# Patient Record
Sex: Male | Born: 1975 | Race: Black or African American | Hispanic: No | Marital: Single | Smoking: Never smoker
Health system: Southern US, Community
[De-identification: ages and names within clinical notes are randomized; demographics above are authoritative.]

## PROBLEM LIST (undated history)

## (undated) DIAGNOSIS — E119 Type 2 diabetes mellitus without complications: Secondary | ICD-10-CM

## (undated) DIAGNOSIS — I10 Essential (primary) hypertension: Secondary | ICD-10-CM

## (undated) DIAGNOSIS — E669 Obesity, unspecified: Secondary | ICD-10-CM

## (undated) HISTORY — DX: Essential (primary) hypertension: I10

## (undated) HISTORY — PX: NASAL SEPTUM SURGERY: SHX37

## (undated) HISTORY — PX: TONSILLECTOMY: SUR1361

## (undated) HISTORY — DX: Obesity, unspecified: E66.9

---

## 2001-06-14 ENCOUNTER — Emergency Department (HOSPITAL_COMMUNITY): Admission: EM | Admit: 2001-06-14 | Discharge: 2001-06-14 | Payer: Self-pay | Admitting: Emergency Medicine

## 2003-11-08 ENCOUNTER — Emergency Department (HOSPITAL_COMMUNITY): Admission: EM | Admit: 2003-11-08 | Discharge: 2003-11-08 | Payer: Self-pay | Admitting: *Deleted

## 2004-01-02 ENCOUNTER — Emergency Department (HOSPITAL_COMMUNITY): Admission: EM | Admit: 2004-01-02 | Discharge: 2004-01-02 | Payer: Self-pay

## 2006-03-29 ENCOUNTER — Ambulatory Visit (HOSPITAL_BASED_OUTPATIENT_CLINIC_OR_DEPARTMENT_OTHER): Admission: RE | Admit: 2006-03-29 | Discharge: 2006-03-29 | Payer: Self-pay | Admitting: Otolaryngology

## 2006-04-01 ENCOUNTER — Ambulatory Visit: Payer: Self-pay | Admitting: Internal Medicine

## 2007-05-08 ENCOUNTER — Emergency Department (HOSPITAL_COMMUNITY): Admission: EM | Admit: 2007-05-08 | Discharge: 2007-05-08 | Payer: Self-pay | Admitting: Emergency Medicine

## 2008-04-23 ENCOUNTER — Ambulatory Visit (HOSPITAL_BASED_OUTPATIENT_CLINIC_OR_DEPARTMENT_OTHER): Admission: RE | Admit: 2008-04-23 | Discharge: 2008-04-23 | Payer: Self-pay | Admitting: Otolaryngology

## 2008-04-25 ENCOUNTER — Ambulatory Visit: Payer: Self-pay | Admitting: Internal Medicine

## 2008-06-26 ENCOUNTER — Encounter (INDEPENDENT_AMBULATORY_CARE_PROVIDER_SITE_OTHER): Payer: Self-pay | Admitting: Otolaryngology

## 2008-06-26 ENCOUNTER — Inpatient Hospital Stay (HOSPITAL_COMMUNITY): Admission: AD | Admit: 2008-06-26 | Discharge: 2008-06-27 | Payer: Self-pay | Admitting: Otolaryngology

## 2010-08-30 LAB — CBC
HCT: 37.7 % — ABNORMAL LOW (ref 39.0–52.0)
Hemoglobin: 12.7 g/dL — ABNORMAL LOW (ref 13.0–17.0)
MCHC: 33.7 g/dL (ref 30.0–36.0)
MCV: 78.4 fL (ref 78.0–100.0)
Platelets: 252 10*3/uL (ref 150–400)
RBC: 4.8 MIL/uL (ref 4.22–5.81)
RDW: 13.6 % (ref 11.5–15.5)
WBC: 7.3 10*3/uL (ref 4.0–10.5)

## 2010-08-30 LAB — BASIC METABOLIC PANEL
BUN: 10 mg/dL (ref 6–23)
CO2: 26 mEq/L (ref 19–32)
Calcium: 9.1 mg/dL (ref 8.4–10.5)
Chloride: 103 mEq/L (ref 96–112)
Creatinine, Ser: 1.15 mg/dL (ref 0.4–1.5)
GFR calc Af Amer: >60 mL/min (ref >60)
GFR calc non Af Amer: >60 mL/min (ref >60)
Glucose, Bld: 149 mg/dL — ABNORMAL HIGH (ref 70–99)
Potassium: 3.6 mEq/L (ref 3.5–5.1)
Sodium: 139 mEq/L (ref 135–145)

## 2010-08-30 LAB — URINALYSIS, ROUTINE W REFLEX MICROSCOPIC
Bilirubin Urine: NEGATIVE
Glucose, UA: NEGATIVE mg/dL
Hgb urine dipstick: NEGATIVE
Ketones, ur: NEGATIVE mg/dL
Nitrite: NEGATIVE
Protein, ur: NEGATIVE mg/dL
Specific Gravity, Urine: 1.021 (ref 1.005–1.030)
Urobilinogen, UA: 0.2 mg/dL (ref 0.0–1.0)
pH: 6.5 (ref 5.0–8.0)

## 2010-09-27 NOTE — Consult Note (Signed)
NAME:  Jeffery Bailey, Jeffery Bailey NO.:  [id number]   MEDICAL RECORD NO.:  [id number]          PATIENT TYPE:  INP   LOCATION:  2603                         FACILITY:  [facility]   PHYSICIAN:  Cassell Clement, M.D. DATE OF BIRTH:  [birth year]   DATE OF CONSULTATION:  06/26/2008  DATE OF DISCHARGE:                                 CONSULTATION   I was asked to see this pleasant 35 year old gentleman by Dr. Haroldine Laws  for evaluation and management of postoperative hypertension.   This is a 35 year old married African American gentleman, who underwent  nasal septoplasty and tonsillectomy today by Dr. Haroldine Laws.  Preoperatively, his blood pressure was elevated slightly at 160/99 and  postoperatively his systolics have been running above 210.  He has  received Apresoline IV once and labetalol intravenously twice in the  recovery room.  The patient does not have any prior history of known  hypertension.  He has no prior cardiac history of any kind.  He is on no  medications at home whatsoever.  He denies chest pain, palpitations, or  dyspnea.   SOCIAL HISTORY:  He works for [employer].  He runs a machine.  He does  not use tobacco, but does drink some alcohol.  The patient is married  and has one child.   FAMILY HISTORY:  Positive for hypertension in his mother, who is living.  His father also is living, but does not have high blood pressure.   PAST MEDICAL HISTORY:  No prior hospitalizations or surgery.   REVIEW OF SYSTEMS:  GASTROINTESTINAL:  No history of peptic ulcer or  dyspepsia.  GENITOURINARY:  Positive for nocturia.  RESPIRATORY:  No  cough or sputum production.  CARDIOVASCULAR:  Negative in detail.   All other systems are negative.   ALLERGIES:  The patient has no known drug allergies.   PHYSICAL EXAMINATION:  VITAL SIGNS:  Blood pressure is presently 191/88,  pulse of 82 and regular, O2 sat with an O2 mask is averaging 99-100%.  GENERAL APPEARANCE:  A large gentleman.   He weighs 310 pounds.  He is in  no acute distress.  SKIN:  Warm and dry.  HEAD/NECK:  The patient has a dressing over his nose area.  Pupils equal  and reactive.  Jugular venous pressure normal.  Carotids normal.  Thyroid normal.  CHEST:  Clear.  HEART:  No murmur, gallop, rub, or click.  ABDOMEN:  Soft and nontender without hepatosplenomegaly or masses.  EXTREMITIES:  Good pedal pulses.  No edema.  No phlebitis.  NEUROLOGIC:  He is alert and oriented.   His electrocardiogram shows normal sinus rhythm and no acute changes.  His preoperative CBC was normal.   IMPRESSION:  Postoperative hypertension with question of preoperative  underlying untreated essential hypertension.   DISPOSITION:  We will plan to get a baseline chest x-ray and a baseline  CMET today.  We will begin him on lisinopril HCT 10/25 one daily, and we  will continue his IV labetalol 20 mg every 4 hours p.r.n. for systolic  pressures greater than 170.  We will anticipate that he will be  observed  overnight on 3300 and then should be able to go home in the morning.  We  expect that his blood pressure would come down overnight.  Dr. Peter  [place] will see the patient tomorrow morning.  We will plan at this  point to send him home on combination of ACE and hydrochlorothiazide and  depending on his response he may also require low-dose beta-blocker.  I  will plan to see him back in the office for blood pressure followup in  about 7-10 days and get a followup BMET at that time as well.           ______________________________  Cassell Clement, M.D.     TB/MEDQ  D:  06/26/2008  T:  06/27/2008  Job:  [id number]   cc:   Hermelinda Medicus, M.D.

## 2010-09-27 NOTE — H&P (Signed)
NAME:  Jeffery Bailey, Jeffery Bailey NO.:  [id number]   MEDICAL RECORD NO.:  [id number]          PATIENT TYPE:  INP   LOCATION:                               FACILITY:  [facility]   PHYSICIAN:  Hermelinda Medicus, M.D.   DATE OF BIRTH:  03/10/1976   DATE OF ADMISSION:  06/26/2008  DATE OF DISCHARGE:  06/27/2008                              HISTORY & PHYSICAL   This patient is a 35 year old male who weighs 315 pounds.  He had a  sleep study which showed BMI of 41, AHI of 9.4, lowest O2 was 86%, RDI  of 9.5.  He spent 25% of his time in REM and deep and stage III sleep,  but has considerable sleep issues with snoring though he does not have  full apnea issue.  He does have a severe septal deviation.  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  He also has a large  tonsils where he has had considerable tonsillitis issues as well.  He  now enters for these 2 diagnoses, one was snoring with septal deviation,  the second is tonsillitis with hypertrophied tonsils.   He takes no medications; however, his past history is one of blood  pressure elevation and his family and he himself coming has a blood  pressure finding of 178/106, 163/102, and during surgery he will be  observed very carefully for this.   PHYSICAL EXAMINATION:  HEENT:  His ears are clear.  Tympanic membranes  are clear.  Oral cavity is completely clear of any ulceration, mass, or  lesion; however, he does have large tonsils which do show some exophytic  nature.  His larynx is clear.  True cords, false cords, epiglottis, base  of tongue are clear.  True cord mobility, gag reflex, tongue mobility,  EOMs, and facial nerve are all symmetrical.  His nose shows quite a  severe septal deviation.  In fact, he appears to have had a septal  hematoma where he has widening of his septum in the ethmoid region with  deviation to both sides causing blockage.  He also has turbinate  hypertrophy and a fairly small nose with  just poor nasal airway.  His  neck is full.  His tongue rides high in his mouth though there is no  evidence of any swelling or carotid or submaxillary gland swelling.  CHEST:  Clear rales, rhonchi or wheezes.  CARDIOVASCULAR:  Normal S1 and S2.  No murmurs, rubs, or gallops.  EXTREMITIES:  Unremarkable.  ABDOMEN:  Unremarkable except he is overweight at 315.   Initial diagnosis is septal deviation, turbinate hypertrophy with nasal  obstruction with severe snoring with tonsillitis, with tonsillar  hypertrophy.   Plan is to do a septal reconstruction, turbinate reduction, and a  tonsillectomy.            ______________________________  Hermelinda Medicus, M.D.     JC/MEDQ  D:  06/26/2008  T:  06/26/2008  Job:  [id number]   cc:   Cassell Clement, M.D.

## 2010-09-27 NOTE — Procedures (Signed)
NAME:  Jeffery Bailey, WEATHERALL NO.:  [id number]   MEDICAL RECORD NO.:  [id number]          PATIENT TYPE:  OUT   LOCATION:  SLEEP CENTER                 FACILITY[facility]  [facility]   PHYSICIAN:  Clinton D. Maple Hudson, MD, FCCP, FACPDATE OF BIRTH:  1974[birth year]03-19   DATE OF STUDY:  04/23/2008                            NOCTURNAL POLYSOMNOGRAM   REFERRING PHYSICIAN:  Hermelinda Medicus, M.D.   REFERRING PHYSICIAN:  Hermelinda Medicus, MD   INDICATION FOR STUDY:  Hypersomnia with sleep apnea.   EPWORTH SLEEPINESS SCORE:  7/24.  BMI 41.1.  Weight 320 pounds.  Height  74 inches.  Neck 19 inches.   HOME MEDICATIONS:  None reported.  A diagnostic NPSG on March 29, 2006, had recorded an AHI of 9.4 per hour.  Diagnostic protocol was  requested for the current study.   SLEEP ARCHITECTURE:  Total sleep time 277 minutes with sleep efficiency  71%.  Stage I was 6.1%.  Stage II 68.1%.  Stage III absent.  REM 25.8%  of total sleep time.  Sleep latency is 16 minutes.  REM latency 60  minutes.  Awake after sleep onset 97 minutes.  Arousal index 13.6.  No  bedtime medication was taken.   RESPIRATORY DATA:  Apnea-hypopnea index (AHI) 4.1 per hour.  Respiratory  disturbance index (RDI) 9.5 per hour with a total of 25 respiratory  events related to arousal for an index of 5.4 per hour.  A total of 19  events were scored including 2 obstructive apneas, 1 central apnea, and  16 hypopneas.  Events were more common while supine.  REM AHI 10.9 per  hour.   OXYGEN DATA:  Moderate to very loud snoring with oxygen desaturation to  a nadir of 86%.  Mean oxygen saturation through the study was 97.5% on  room air.   CARDIAC DATA:  Normal sinus rhythm.   MOVEMENT/PARASOMNIA:  No significant movement disturbance.  Bathroom x1.   IMPRESSION/RECOMMENDATION:  1. Occasional respiratory events with sleep disturbance, apnea-      hypopnea index 4.1 per hour (normal range 0-5 per hour).      Additional events were recorded,  which did not meet duration      requirements to be scored as apneas and hypopneas, giving a      respiratory disturbance index (RDI) of 9.5 per hour, which would      qualify for a diagnosis of mild obstructive sleep apnea/hypopnea      syndrome.  2. Conservative measures including weight loss might be sufficient.      Consider efforts to improve the upper nasal airway.  CPAP would not      usefully be used for scores in this range, consider alternative      methods first.      Clinton D. Maple Hudson, MD, [facility], FACP  Diplomate, Biomedical engineer of Sleep Medicine  Electronically Signed     CDY/MEDQ  D:  04/25/2008 14:06:51  T:  04/25/2008 20:46:52  Job:  [id number]

## 2010-09-27 NOTE — Op Note (Signed)
NAME:  JAD, JOHANSSON NO.:  [id number]   MEDICAL RECORD NO.:  [id number]           PATIENT TYPE:   LOCATION:                                 FACILITY:   PHYSICIAN:  Hermelinda Medicus, M.D.   DATE OF BIRTH:  Oct 05, 1975   DATE OF PROCEDURE:  DATE OF DISCHARGE:                               OPERATIVE REPORT   This patient is a 35 year old male who has severe snoring and a septal  deviation and tonsillitis.  He is aware of the risks and gains of his  surgery, the fact that he could have tonsillar bleeding.  He is going to  be on a special diet for approximately 10 days and he cannot travel for  10 days.  He also will have packing in his nose and is aware that will  limit his activities for at least 2 weeks in terms of physical  activities and he will be out of work for approximately the better part  of a week.   PREOPERATIVE DIAGNOSES:  1. Septal deviation with history of nasal trauma with turbinate      hypertrophy with poor nasal airway.  2. Tonsillitis with tonsillar hypertrophy.   POSTOPERATIVE DIAGNOSES:  1. Septal deviation with history of nasal trauma with turbinate      hypertrophy with poor nasal airway.  2. Tonsillitis with tonsillar hypertrophy.   OPERATION:  Septal reconstruction, turbinate reduction, and  tonsillectomy.   OPERATOR:  Hermelinda Medicus, MD.   ANESTHESIA:  General endotracheal with Dr. Michelle Piper.   PROCEDURE:  The patient was placed in supine position and under general  endotracheal anesthesia the septum was approached where topical cocaine  200 mg was used and 1% Xylocaine with epinephrine.  The turbinates were  outfractured especially the inferior turbinates which were really quite  large and we gained considerable space there.  Middle turbinates were of  reasonable size.  The septum was widened as of it had a hematoma on the  ethmoid vomerine portion of the septum that was off the premaxillary  crest as well and deviated to the right  primarily.  A hemitransfixion  incision was made on his left side and carried back along the  quadrilateral cartilage and a strip of cartilage was taken from the  floor of the nose as well as the posterior from the quadrilateral  cartilage.  The ethmoid septum was widened and deviated and telescoped,  and this was taken down using the open and close Oolitic.  The  vomerine septum was also taken down and then the 4-mm chisel was used to  take down the premaxillary crest deviation which was off the  premaxillary crest with this.  Using the open and close Laren Boom, we were able to trim the vomerine septum was well.  Once we  did this and got that thickened, widened area taken out, then the  remainder of the septum was in quite decent condition.  At that point,  we then closed the incision with 5-0 plain catgut in a through-and-  through septal sutures of blanket stitch using 4-0 plain.  The  turbinates were again outfractured aggressively and then an anesthesia  trumpet was placed on his right side and a Merocel pack was placed on  the left.  Attention was then carried to the oral cavity where the Vernelle Emerald was placed and a very large exudative type tonsils containing  purulent material were removed after the Vernelle Emerald was placed.  The  tonsils were removed using blunt and then Bovie electrocoagulation.  We  removed the tonsils using Bovie coagulation and blunt dissection and  gained considerable space within his throat, but we will resolve this  tonsillitis issue.  Oral cavity was suctioned.  The nasopharynx was  suctioned.  The nasal area was completely dry as were the tonsillar beds  as we slowly removed the [employer].  The patient was taken to  recovery room in good condition doing very well.  The airway was  excellent; however, his blood pressure was still elevated and we are  going to have a cardiologist seen him and will be treating him in the   recovery room under anesthesia care.  He will be kept overnight on 3300  observation.  Further follow up will be at the end of 5 days, 10 days, 3  weeks, 6 weeks, 3 months, and 6 months.           ______________________________  Hermelinda Medicus, M.D.     JC/MEDQ  D:  06/26/2008  T:  06/26/2008  Job:  [id number]   cc:   Cassell Clement, M.D.

## 2010-09-30 NOTE — Procedures (Signed)
NAME:  Jeffery Bailey, Jeffery Bailey NO.:  [id number]   MEDICAL RECORD NO.:  [id number]          PATIENT TYPE:  OUT   LOCATION:  SLEEP CENTER                 FACILITY:  [facility]   PHYSICIAN:  Clinton D. Maple Hudson, MD, FCCP, FACPDATE OF BIRTH:  23[birth year]1974   DATE OF STUDY:  03/29/2006                              NOCTURNAL POLYSOMNOGRAM   INDICATIONS FOR STUDY:  Hypersomnia with sleep apnea. Epworth sleepiness  score 9/24, BMI 38, weight 300 pounds. No medications were listed. A  diagnostic NP SG protocol was requested.   SLEEP ARCHITECTURE:  Total sleep time 368 minutes with sleep efficiency 80%.  Stage 1 was 5%, stage 2 was 62%, stages 3 and 4 18%. REM 15% of total sleep  time. Sleep latency 18 minutes, REM latency 65 minutes, awake after sleep  onset 75 minutes. Arousable index 10.6. No bedtime medications were taken.   RESPIRATORY DATA:  Apnea hypopnea index (AHI, RDI) 9.4 obstructive events  per hour, indicating mild obstructive sleep apnea/hypopnea syndrome. There  were 1 central apnea, 24 obstructive apnea's, and 33 hypopnea's. Events were  not positional. REM AHI 42 per hour. NP SG protocol was followed.   OXYGEN DATA:  Moderate to severe snoring with oxygen desaturation to a nadir  of 78%. Mean oxygen saturation through the study was 97% on room air.   CARDIAC DATA:  Normal sinus rhythm.   MOVEMENT/PARASOMNIA:  A total of 24 limb jerks were reported, which 20 were  associated with arousable or awakening from periodic limb with arousable  index of 3.3 per hour, which is mildly increased.   IMPRESSION/RECOMMENDATIONS:  1. Mild obstructive sleep apnea/hypopnea syndrome, AHI 9.4 per hour. Non-      positional events with moderate to loud snoring and oxygen desaturation      to 78%.  2. CPAP therapy would be considered as a secondary approach with scores in      this range but he can return to CPAP titration if appropriate.  3. Periodic limb movement with arousal, 3.3 per  hour.      Clinton D. Maple Hudson, MD, [facility], FACP  Diplomate, Biomedical engineer of Sleep Medicine  Electronically Signed     CDY/MEDQ  D:  04/01/2006 12:39:35  T:  04/01/2006 21:51:40  Job:  [id number]

## 2010-12-13 ENCOUNTER — Emergency Department (HOSPITAL_COMMUNITY)
Admission: EM | Admit: 2010-12-13 | Discharge: 2010-12-13 | Disposition: A | Discharge status: Home / Self Care | Payer: 59 | Attending: Emergency Medicine | Admitting: Emergency Medicine

## 2010-12-13 DIAGNOSIS — E119 Type 2 diabetes mellitus without complications: Secondary | ICD-10-CM | POA: Insufficient documentation

## 2010-12-13 DIAGNOSIS — I10 Essential (primary) hypertension: Secondary | ICD-10-CM | POA: Insufficient documentation

## 2010-12-13 LAB — BASIC METABOLIC PANEL
BUN: 29 mg/dL — ABNORMAL HIGH (ref 6–23)
CO2: 25 mEq/L (ref 19–32)
Calcium: 10.1 mg/dL (ref 8.4–10.5)
Chloride: 89 mEq/L — ABNORMAL LOW (ref 96–112)
Creatinine, Ser: 1.96 mg/dL — ABNORMAL HIGH (ref 0.50–1.35)
GFR calc Af Amer: 47 mL/min — ABNORMAL LOW (ref >60)
GFR calc non Af Amer: 39 mL/min — ABNORMAL LOW (ref >60)
Glucose, Bld: 645 mg/dL (ref 70–99)
Potassium: 4 mEq/L (ref 3.5–5.1)
Sodium: 125 mEq/L — ABNORMAL LOW (ref 135–145)

## 2010-12-13 LAB — GLUCOSE, CAPILLARY
Glucose-Capillary: >600 mg/dL (ref 70–99)
Glucose-Capillary: 372 mg/dL — ABNORMAL HIGH (ref 70–99)
Glucose-Capillary: 327 mg/dL — ABNORMAL HIGH (ref 70–99)

## 2010-12-13 LAB — DIFFERENTIAL
Basophils Absolute: 0 10*3/uL (ref 0.0–0.1)
Basophils Relative: 0 % (ref 0–1)
Eosinophils Absolute: 0.1 10*3/uL (ref 0.0–0.7)
Eosinophils Relative: 1 % (ref 0–5)
Lymphocytes Relative: 31 % (ref 12–46)
Lymphs Abs: 2.2 10*3/uL (ref 0.7–4.0)
Monocytes Absolute: 0.5 10*3/uL (ref 0.1–1.0)
Monocytes Relative: 7 % (ref 3–12)
Neutro Abs: 4.2 10*3/uL (ref 1.7–7.7)
Neutrophils Relative %: 61 % (ref 43–77)

## 2010-12-13 LAB — URINALYSIS, ROUTINE W REFLEX MICROSCOPIC
Bilirubin Urine: NEGATIVE
Glucose, UA: >1000 mg/dL — AB
Hgb urine dipstick: NEGATIVE
Ketones, ur: NEGATIVE mg/dL
Leukocytes, UA: NEGATIVE
Nitrite: NEGATIVE
Protein, ur: NEGATIVE mg/dL
Specific Gravity, Urine: 1.034 — ABNORMAL HIGH (ref 1.005–1.030)
Urobilinogen, UA: 0.2 mg/dL (ref 0.0–1.0)
pH: 6 (ref 5.0–8.0)

## 2010-12-13 LAB — CBC
HCT: 35.7 % — ABNORMAL LOW (ref 39.0–52.0)
Hemoglobin: 11.7 g/dL — ABNORMAL LOW (ref 13.0–17.0)
MCH: 24.2 pg — ABNORMAL LOW (ref 26.0–34.0)
MCHC: 32.8 g/dL (ref 30.0–36.0)
MCV: 73.8 fL — ABNORMAL LOW (ref 78.0–100.0)
Platelets: 228 10*3/uL (ref 150–400)
RBC: 4.84 MIL/uL (ref 4.22–5.81)
RDW: 12.8 % (ref 11.5–15.5)
WBC: 7 10*3/uL (ref 4.0–10.5)

## 2010-12-13 LAB — URINE MICROSCOPIC-ADD ON: Urine-Other: NONE SEEN

## 2011-02-17 LAB — DIFFERENTIAL
Basophils Absolute: 0
Basophils Relative: 1
Eosinophils Absolute: 0.1
Eosinophils Relative: 1
Lymphocytes Relative: 33
Lymphs Abs: 2.2
Monocytes Absolute: 0.6
Monocytes Relative: 9
Neutro Abs: 3.8
Neutrophils Relative %: 57

## 2011-02-17 LAB — CBC
HCT: 39.1
Hemoglobin: 13.1
MCHC: 33.5
MCV: 77.1 — ABNORMAL LOW
Platelets: 293
RBC: 5.07
RDW: 13.9
WBC: 6.8

## 2011-02-17 LAB — URINALYSIS, ROUTINE W REFLEX MICROSCOPIC
Bilirubin Urine: NEGATIVE
Glucose, UA: NEGATIVE
Ketones, ur: NEGATIVE
Leukocytes, UA: NEGATIVE
Nitrite: NEGATIVE
Protein, ur: 30 — AB
Specific Gravity, Urine: 1.029
Urobilinogen, UA: 0.2
pH: 7.5

## 2011-02-17 LAB — COMPREHENSIVE METABOLIC PANEL
ALT: 20
AST: 21
Albumin: 3.9
Alkaline Phosphatase: 40
BUN: 8
CO2: 26
Calcium: 9.6
Chloride: 108
Creatinine, Ser: 1.33
GFR calc Af Amer: >60
GFR calc non Af Amer: >60
Glucose, Bld: 117 — ABNORMAL HIGH
Potassium: 3.5
Sodium: 142
Total Bilirubin: 0.8
Total Protein: 7.1

## 2011-02-17 LAB — LIPASE, BLOOD: Lipase: 19

## 2011-02-17 LAB — URINE MICROSCOPIC-ADD ON

## 2013-03-26 ENCOUNTER — Emergency Department (HOSPITAL_BASED_OUTPATIENT_CLINIC_OR_DEPARTMENT_OTHER): Payer: No Typology Code available for payment source

## 2013-03-26 ENCOUNTER — Emergency Department (HOSPITAL_BASED_OUTPATIENT_CLINIC_OR_DEPARTMENT_OTHER)
Admission: EM | Admit: 2013-03-26 | Discharge: 2013-03-26 | Disposition: A | Discharge status: Home / Self Care | Payer: No Typology Code available for payment source | Attending: Emergency Medicine | Admitting: Emergency Medicine

## 2013-03-26 ENCOUNTER — Encounter (HOSPITAL_BASED_OUTPATIENT_CLINIC_OR_DEPARTMENT_OTHER): Payer: Self-pay | Admitting: Emergency Medicine

## 2013-03-26 DIAGNOSIS — E119 Type 2 diabetes mellitus without complications: Secondary | ICD-10-CM | POA: Insufficient documentation

## 2013-03-26 DIAGNOSIS — S0990XA Unspecified injury of head, initial encounter: Secondary | ICD-10-CM | POA: Insufficient documentation

## 2013-03-26 DIAGNOSIS — Z79899 Other long term (current) drug therapy: Secondary | ICD-10-CM | POA: Insufficient documentation

## 2013-03-26 DIAGNOSIS — S4980XA Other specified injuries of shoulder and upper arm, unspecified arm, initial encounter: Secondary | ICD-10-CM | POA: Insufficient documentation

## 2013-03-26 DIAGNOSIS — E669 Obesity, unspecified: Secondary | ICD-10-CM | POA: Insufficient documentation

## 2013-03-26 DIAGNOSIS — Y92410 Unspecified street and highway as the place of occurrence of the external cause: Secondary | ICD-10-CM | POA: Insufficient documentation

## 2013-03-26 DIAGNOSIS — I10 Essential (primary) hypertension: Secondary | ICD-10-CM | POA: Insufficient documentation

## 2013-03-26 DIAGNOSIS — S46909A Unspecified injury of unspecified muscle, fascia and tendon at shoulder and upper arm level, unspecified arm, initial encounter: Secondary | ICD-10-CM | POA: Insufficient documentation

## 2013-03-26 DIAGNOSIS — Y9389 Activity, other specified: Secondary | ICD-10-CM | POA: Insufficient documentation

## 2013-03-26 HISTORY — DX: Type 2 diabetes mellitus without complications: E11.9

## 2013-03-26 MED ORDER — ACETAMINOPHEN 500 MG PO TABS
1000.0000 mg | ORAL_TABLET | Freq: Once | ORAL | Status: AC
  Administered 2013-03-26: 1000 mg via ORAL
  Filled 2013-03-26: qty 2
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
## 2014-09-27 ENCOUNTER — Encounter (HOSPITAL_COMMUNITY): Payer: Self-pay | Admitting: *Deleted

## 2014-09-27 ENCOUNTER — Emergency Department (INDEPENDENT_AMBULATORY_CARE_PROVIDER_SITE_OTHER): Payer: 59

## 2014-09-27 ENCOUNTER — Emergency Department (INDEPENDENT_AMBULATORY_CARE_PROVIDER_SITE_OTHER)
Admission: EM | Admit: 2014-09-27 | Discharge: 2014-09-27 | Disposition: A | Discharge status: Home / Self Care | Payer: 59 | Source: Home / Self Care | Attending: Family Medicine | Admitting: Family Medicine

## 2014-09-27 DIAGNOSIS — J069 Acute upper respiratory infection, unspecified: Secondary | ICD-10-CM

## 2014-09-27 MED ORDER — HYDROCOD POLST-CPM POLST ER 10-8 MG/5ML PO SUER: 5.0000 mL | Freq: Two times a day (BID) | ORAL | Status: DC | PRN

## 2014-09-27 MED ORDER — AZITHROMYCIN 250 MG PO TABS: ORAL_TABLET | ORAL | Status: DC

## 2014-09-27 NOTE — ED Provider Notes (Signed)
CSN: [id number]     Arrival date & time 09/27/14  1640 History   First MD Initiated Contact with Patient 09/27/14 1654     Chief Complaint  Patient presents with  . Cough   (Consider location/radiation/quality/duration/timing/severity/associated sxs/prior Treatment) Patient is a 39 y.o. male presenting with cough. The history is provided by the patient and the spouse.  Cough Cough characteristics:  Non-productive and dry Severity:  Moderate Duration:  1 week Progression:  Unchanged Chronicity:  New Context: upper respiratory infection   Relieved by:  None tried Worsened by:  Nothing tried Ineffective treatments:  None tried Associated symptoms: fever and rhinorrhea   Associated symptoms: no chills, no shortness of breath, no sore throat and no wheezing     Past Medical History  Diagnosis Date  . Hypertension   . Obesity   . Diabetes mellitus without complication    Past Surgical History  Procedure Laterality Date  . Nasal septum surgery    . Tonsillectomy     Family History  Problem Relation Age of Onset  . Hyperlipidemia Mother    History  Substance Use Topics  . Smoking status: Never Smoker   . Smokeless tobacco: Not on file  . Alcohol Use: Yes    Review of Systems  Constitutional: Positive for fever. Negative for chills.  HENT: Positive for congestion, postnasal drip and rhinorrhea. Negative for sore throat.   Respiratory: Positive for cough. Negative for shortness of breath and wheezing.   Gastrointestinal: Negative.   Musculoskeletal: Negative.   Skin: Negative.     Allergies  Review of patient's allergies indicates no known allergies.  Home Medications   Prior to Admission medications   Medication Sig Start Date End Date Taking? Authorizing Provider  azithromycin (ZITHROMAX Z-PAK) 250 MG tablet Take as directed on pack 09/27/14   Billy Fischer, MD  chlorpheniramine-HYDROcodone [facility]) 10-8 MG/5ML SUER Take 5 mLs by mouth every 12  (twelve) hours as needed for cough. 09/27/14   Billy Fischer, MD  lisinopril-hydrochlorothiazide (PRINZIDE,ZESTORETIC) 10-12.5 MG per tablet Take 1 tablet by mouth daily.    Historical Provider, MD  Nebivolol HCl (BYSTOLIC) 20 MG TABS Take 20 mg by mouth daily.    Historical Provider, MD   BP 159/90 mmHg  Pulse 66  Temp(Src) 98.7 F (37.1 C) (Oral)  Resp 18  SpO2 96% Physical Exam  Constitutional: He is oriented to person, place, and time. He appears well-developed and well-nourished. No distress.  HENT:  Head: Normocephalic.  Right Ear: External ear normal.  Left Ear: External ear normal.  Mouth/Throat: Oropharynx is clear and moist.  Neck: Normal range of motion. Neck supple.  Cardiovascular: Normal rate, regular rhythm, normal heart sounds and intact distal pulses.   Pulmonary/Chest: He has rales.  Abdominal: Soft. Bowel sounds are normal.  Lymphadenopathy:    He has no cervical adenopathy.  Neurological: He is alert and oriented to person, place, and time.  Skin: Skin is warm and dry.  Nursing note and vitals reviewed.   ED Course  Procedures (including critical care time) Labs Review Labs Reviewed - No data to display  Imaging Review Dg Chest 2 View  09/27/2014   CLINICAL DATA:  Persistent productive cough.  Fever.  EXAM: CHEST  2 VIEW  COMPARISON:  06/26/2008 and 11/08/2003  FINDINGS: The heart size and mediastinal contours are within normal limits. Both lungs are clear. Old healed fracture of the mid right clavicle with residual deformity.  IMPRESSION: No active cardiopulmonary disease.  Electronically Signed   By: Lorriane Shire M.D.   On: 09/27/2014 17:40    X-rays reviewed and report per radiologist.  MDM   1. URI (upper respiratory infection)        Billy Fischer, MD 09/27/14 1757

## 2014-09-27 NOTE — Discharge Instructions (Signed)
Drink plenty of fluids as discussed, use medicine as prescribed, and mucinex for cough. Return or see your doctor if further problems °

## 2014-09-27 NOTE — ED Notes (Signed)
Pt  Reports  Symptoms   Of  Cough          Congestion     Seen   about1  Week  Ago  By  Another urgent  Care         And  Was    Advised   mucionex          His   Symptoms  Are   Worse

## 2015-03-10 ENCOUNTER — Other Ambulatory Visit: Payer: Self-pay | Admitting: Internal Medicine

## 2015-03-10 ENCOUNTER — Ambulatory Visit
Admission: RE | Admit: 2015-03-10 | Discharge: 2015-03-10 | Disposition: A | Discharge status: Home / Self Care | Payer: 59 | Source: Ambulatory Visit | Attending: Internal Medicine | Admitting: Internal Medicine

## 2015-03-10 ENCOUNTER — Other Ambulatory Visit: Payer: Self-pay | Admitting: Nurse Practitioner

## 2015-03-10 DIAGNOSIS — M542 Cervicalgia: Secondary | ICD-10-CM

## 2015-11-26 ENCOUNTER — Emergency Department (HOSPITAL_COMMUNITY)
Admission: EM | Admit: 2015-11-26 | Discharge: 2015-11-26 | Disposition: A | Discharge status: Home / Self Care | Payer: 59 | Attending: Emergency Medicine | Admitting: Emergency Medicine

## 2015-11-26 ENCOUNTER — Encounter (HOSPITAL_COMMUNITY): Payer: Self-pay

## 2015-11-26 DIAGNOSIS — Y939 Activity, unspecified: Secondary | ICD-10-CM | POA: Diagnosis not present

## 2015-11-26 DIAGNOSIS — Z79899 Other long term (current) drug therapy: Secondary | ICD-10-CM | POA: Diagnosis not present

## 2015-11-26 DIAGNOSIS — E119 Type 2 diabetes mellitus without complications: Secondary | ICD-10-CM | POA: Insufficient documentation

## 2015-11-26 DIAGNOSIS — S61412A Laceration without foreign body of left hand, initial encounter: Secondary | ICD-10-CM

## 2015-11-26 DIAGNOSIS — W268XXA Contact with other sharp object(s), not elsewhere classified, initial encounter: Secondary | ICD-10-CM | POA: Diagnosis not present

## 2015-11-26 DIAGNOSIS — Y929 Unspecified place or not applicable: Secondary | ICD-10-CM | POA: Diagnosis not present

## 2015-11-26 DIAGNOSIS — I10 Essential (primary) hypertension: Secondary | ICD-10-CM | POA: Insufficient documentation

## 2015-11-26 DIAGNOSIS — Z792 Long term (current) use of antibiotics: Secondary | ICD-10-CM | POA: Diagnosis not present

## 2015-11-26 DIAGNOSIS — Y999 Unspecified external cause status: Secondary | ICD-10-CM | POA: Diagnosis not present

## 2015-11-26 MED ORDER — CEPHALEXIN 500 MG PO CAPS: 500.0000 mg | ORAL_CAPSULE | Freq: Four times a day (QID) | ORAL | Status: DC

## 2015-11-26 MED ORDER — BACITRACIN 500 UNIT/GM EX OINT
1.0000 "application " | TOPICAL_OINTMENT | Freq: Two times a day (BID) | CUTANEOUS | Status: DC
  Filled 2015-11-26: qty 0.9

## 2015-11-26 MED ORDER — LIDOCAINE-EPINEPHRINE 2 %-1:100000 IJ SOLN
20.0000 mL | Freq: Once | INTRAMUSCULAR | Status: AC
  Administered 2015-11-26: 20 mL via INTRADERMAL
  Filled 2015-11-26: qty 1
  Filled 2015-11-26: qty 20

## 2015-11-26 MED ORDER — LIDOCAINE-EPINEPHRINE (PF) 2 %-1:200000 IJ SOLN
10.0000 mL | Freq: Once | INTRAMUSCULAR | Status: DC
  Filled 2015-11-26: qty 20

## 2015-11-26 MED ORDER — NAPROXEN 500 MG PO TABS: 500.0000 mg | ORAL_TABLET | Freq: Two times a day (BID) | ORAL | Status: DC

## 2015-11-26 MED ORDER — TETANUS-DIPHTH-ACELL PERTUSSIS 5-2.5-18.5 LF-MCG/0.5 IM SUSP
0.5000 mL | Freq: Once | INTRAMUSCULAR | Status: AC
  Administered 2015-11-26: 0.5 mL via INTRAMUSCULAR
  Filled 2015-11-26: qty 0.5
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
## 2015-11-26 NOTE — Discharge Instructions (Signed)
You have been seen today for a hand wound. The laceration was repaired. The sutures used will dissolve on their own and do not need to be removed, however, you will need to go to your PCP or return to the ED in about 3 days for a wound check to assure proper healing.   Remove the bandage after 24 hours. You must wait at least 8 hours after the wound repair to wash the wound. Clean the wound and surrounding area gently with tap water and mild soap. Rinse well and blot dry. Do not scrub the wound, as this may cause the wound edges to come apart. You may shower, but avoid submerging the wound, such as with a bath or swimming. Clean the wound daily to prevent infection. Reapplication of a topical antibiotic ointment, such as Neosporin, will decrease scab formation and reduce any scarring. You may use Tylenol, naproxen, ibuprofen for pain.  Return to the ED sooner should the wound edges come apart or signs of infection arise, such as spreading redness, puffiness/swelling, pus draining from the wound, severe increase in pain, or any other major issues.

## 2015-11-26 NOTE — ED Provider Notes (Signed)
CSN: [id number]     Arrival date & time 11/26/15  1646 History  By signing my name below, I, Jeffery Bailey, attest that this documentation has been prepared under the direction and in the presence of Shawn Joy, PA-C.  Electronically Signed: Tedra Bailey. Sheppard Coil, ED Scribe. 11/26/2015. 5:20 PM.    Chief Complaint  Patient presents with  . Extremity Laceration   HPI  HPI Comments: Jeffery Bailey is a 40 y.o. male who presents to the Emergency Department complaining of sudden onset, constant, 5/10 left hand pain s/p hand laceration x 1 hr PTA. Pt states he was cutting some bamboo when he slipped and hit his hand with a machete. Pt has associated swelling of the area. Bleeding is controlled. Tetanus is not up to date. There are no modifying factors. Pt has no other complaints at this time.  Past Medical History  Diagnosis Date  . Hypertension   . Obesity   . Diabetes mellitus without complication [facility])    Past Surgical History  Procedure Laterality Date  . Nasal septum surgery    . Tonsillectomy     Family History  Problem Relation Age of Onset  . Hyperlipidemia Mother    Social History  Substance Use Topics  . Smoking status: Never Smoker   . Smokeless tobacco: None  . Alcohol Use: Yes    Review of Systems  Skin: Positive for wound.  Neurological: Negative for weakness and numbness.   Allergies  Review of patient's allergies indicates no known allergies.  Home Medications   Prior to Admission medications   Medication Sig Start Date End Date Taking? Authorizing Provider  azithromycin (ZITHROMAX Z-PAK) 250 MG tablet Take as directed on pack 09/27/14   Billy Fischer, MD  cephALEXin (KEFLEX) 500 MG capsule Take 1 capsule (500 mg total) by mouth 4 (four) times daily. 11/26/15   Shawn C Joy, PA-C  chlorpheniramine-HYDROcodone (TUSSIONEX PENNKINETIC ER) 10-8 MG/5ML SUER Take 5 mLs by mouth every 12 (twelve) hours as needed for cough. 09/27/14   Billy Fischer, MD   lisinopril-hydrochlorothiazide (PRINZIDE,ZESTORETIC) 10-12.5 MG per tablet Take 1 tablet by mouth daily.    Historical Provider, MD  naproxen (NAPROSYN) 500 MG tablet Take 1 tablet (500 mg total) by mouth 2 (two) times daily. 11/26/15   Shawn C Joy, PA-C  Nebivolol HCl (BYSTOLIC) 20 MG TABS Take 20 mg by mouth daily.    Historical Provider, MD   BP 147/90 mmHg  Pulse 66  Temp(Src) 98.9 F (37.2 C) (Oral)  Resp 20  SpO2 100% Physical Exam  Constitutional: He appears well-developed and well-nourished. No distress.  HENT:  Head: Normocephalic and atraumatic.  Eyes: Conjunctivae are normal.  Neck: Neck supple.  Cardiovascular: Normal rate and regular rhythm.   Pulmonary/Chest: Effort normal.  Musculoskeletal: Normal range of motion.  Bones of the hand are stable and non-tender. Full ROM in left hand and wrist.   Neurological: He is alert.  No sensory deficits. Strength is 5/5. Coordination in the bilateral hands is intact.  Skin: Skin is warm and dry. He is not diaphoretic.  1.5 cm laceration to the dorsal left hand over the second metacarpal. No active hemorrhage. No foreign bodies  Psychiatric: He has a normal mood and affect. His behavior is normal.  Nursing note and vitals reviewed.   ED Course  .Marland KitchenLaceration Repair Date/Time: 11/26/2015 5:46 PM Performed by: Lorayne Bender Authorized by: Lorayne Bender Consent: Verbal consent obtained. Risks and benefits: risks, benefits and  alternatives were discussed Consent given by: patient Patient understanding: patient states understanding of the procedure being performed Patient consent: the patient's understanding of the procedure matches consent given Procedure consent: procedure consent matches procedure scheduled Patient identity confirmed: verbally with patient and arm band Body area: upper extremity Location details: left hand Laceration length: 1.5 cm Foreign bodies: no foreign bodies Tendon involvement: none Nerve involvement:  none Vascular damage: no Anesthesia: local infiltration Local anesthetic: lidocaine 2% with epinephrine Anesthetic total: 1 ml Patient sedated: no Preparation: Patient was prepped and draped in the usual sterile fashion. Irrigation solution: tap water Irrigation method: tap Amount of cleaning: extensive Debridement: none Degree of undermining: none Wound skin closure material used: 5-0 Vicryl Rapide. Number of sutures: 3 Technique: simple Approximation: close Approximation difficulty: simple Dressing: 4x4 sterile gauze and antibiotic ointment Patient tolerance: Patient tolerated the procedure well with no immediate complications   (including critical care time) DIAGNOSTIC STUDIES: Oxygen Saturation is 100% on RA, normal by my interpretation.    COORDINATION OF CARE: 5:18 PM-Discussed treatment plan which includes laceration repair and Tdap booster with pt at bedside and pt agreed to plan.     MDM   Final diagnoses:  Hand laceration, left, initial encounter    Jeffery Bailey presents with a hand laceration that occurred about an hour ago.  Laceration repaired successfully. Patient to follow-up in about 3 days for a wound check. No suture removal necessary. Antibiotics prescribed due to wound location. No indication for imaging based on physical exam. The patient was given instructions for home care as well as return precautions. Patient voices understanding of these instructions, accepts the plan, and is comfortable with discharge.  I personally performed the services described in this documentation, which was scribed in my presence. The recorded information has been reviewed and is accurate.   Lorayne Bender, PA-C 11/26/15 1810  Virgel Manifold, MD 11/26/15 2350

## 2019-07-16 DIAGNOSIS — G5613 Other lesions of median nerve, bilateral upper limbs: Secondary | ICD-10-CM | POA: Diagnosis not present

## 2019-07-16 DIAGNOSIS — S53401A Unspecified sprain of right elbow, initial encounter: Secondary | ICD-10-CM | POA: Diagnosis not present

## 2019-07-16 DIAGNOSIS — M25621 Stiffness of right elbow, not elsewhere classified: Secondary | ICD-10-CM | POA: Diagnosis not present

## 2019-07-16 DIAGNOSIS — M7711 Lateral epicondylitis, right elbow: Secondary | ICD-10-CM | POA: Diagnosis not present

## 2019-07-18 DIAGNOSIS — S53401A Unspecified sprain of right elbow, initial encounter: Secondary | ICD-10-CM | POA: Diagnosis not present

## 2019-07-18 DIAGNOSIS — M7711 Lateral epicondylitis, right elbow: Secondary | ICD-10-CM | POA: Diagnosis not present

## 2019-07-18 DIAGNOSIS — G5613 Other lesions of median nerve, bilateral upper limbs: Secondary | ICD-10-CM | POA: Diagnosis not present

## 2019-07-18 DIAGNOSIS — M25621 Stiffness of right elbow, not elsewhere classified: Secondary | ICD-10-CM | POA: Diagnosis not present

## 2019-07-25 DIAGNOSIS — M25621 Stiffness of right elbow, not elsewhere classified: Secondary | ICD-10-CM | POA: Diagnosis not present

## 2019-07-25 DIAGNOSIS — G5613 Other lesions of median nerve, bilateral upper limbs: Secondary | ICD-10-CM | POA: Diagnosis not present

## 2019-07-25 DIAGNOSIS — S53401A Unspecified sprain of right elbow, initial encounter: Secondary | ICD-10-CM | POA: Diagnosis not present

## 2019-07-25 DIAGNOSIS — M7711 Lateral epicondylitis, right elbow: Secondary | ICD-10-CM | POA: Diagnosis not present

## 2019-07-28 DIAGNOSIS — S53401A Unspecified sprain of right elbow, initial encounter: Secondary | ICD-10-CM | POA: Diagnosis not present

## 2019-07-28 DIAGNOSIS — G5613 Other lesions of median nerve, bilateral upper limbs: Secondary | ICD-10-CM | POA: Diagnosis not present

## 2019-07-28 DIAGNOSIS — M7711 Lateral epicondylitis, right elbow: Secondary | ICD-10-CM | POA: Diagnosis not present

## 2019-07-28 DIAGNOSIS — M25621 Stiffness of right elbow, not elsewhere classified: Secondary | ICD-10-CM | POA: Diagnosis not present

## 2019-08-07 DIAGNOSIS — I10 Essential (primary) hypertension: Secondary | ICD-10-CM | POA: Diagnosis not present

## 2019-08-07 DIAGNOSIS — Z566 Other physical and mental strain related to work: Secondary | ICD-10-CM | POA: Diagnosis not present

## 2019-08-07 DIAGNOSIS — E1169 Type 2 diabetes mellitus with other specified complication: Secondary | ICD-10-CM | POA: Diagnosis not present

## 2019-08-22 DIAGNOSIS — G5613 Other lesions of median nerve, bilateral upper limbs: Secondary | ICD-10-CM | POA: Diagnosis not present

## 2019-08-22 DIAGNOSIS — M25621 Stiffness of right elbow, not elsewhere classified: Secondary | ICD-10-CM | POA: Diagnosis not present

## 2019-08-22 DIAGNOSIS — S53401A Unspecified sprain of right elbow, initial encounter: Secondary | ICD-10-CM | POA: Diagnosis not present

## 2019-08-22 DIAGNOSIS — M7711 Lateral epicondylitis, right elbow: Secondary | ICD-10-CM | POA: Diagnosis not present

## 2019-08-29 DIAGNOSIS — S53401A Unspecified sprain of right elbow, initial encounter: Secondary | ICD-10-CM | POA: Diagnosis not present

## 2019-08-29 DIAGNOSIS — M25621 Stiffness of right elbow, not elsewhere classified: Secondary | ICD-10-CM | POA: Diagnosis not present

## 2019-08-29 DIAGNOSIS — M7711 Lateral epicondylitis, right elbow: Secondary | ICD-10-CM | POA: Diagnosis not present

## 2019-08-29 DIAGNOSIS — G5613 Other lesions of median nerve, bilateral upper limbs: Secondary | ICD-10-CM | POA: Diagnosis not present

## 2019-09-01 DIAGNOSIS — M25521 Pain in right elbow: Secondary | ICD-10-CM | POA: Diagnosis not present

## 2019-09-01 DIAGNOSIS — M7711 Lateral epicondylitis, right elbow: Secondary | ICD-10-CM | POA: Diagnosis not present

## 2019-09-05 DIAGNOSIS — S53401A Unspecified sprain of right elbow, initial encounter: Secondary | ICD-10-CM | POA: Diagnosis not present

## 2019-09-05 DIAGNOSIS — M7711 Lateral epicondylitis, right elbow: Secondary | ICD-10-CM | POA: Diagnosis not present

## 2019-09-05 DIAGNOSIS — G5613 Other lesions of median nerve, bilateral upper limbs: Secondary | ICD-10-CM | POA: Diagnosis not present

## 2019-09-05 DIAGNOSIS — M25621 Stiffness of right elbow, not elsewhere classified: Secondary | ICD-10-CM | POA: Diagnosis not present

## 2019-09-17 DIAGNOSIS — S53401A Unspecified sprain of right elbow, initial encounter: Secondary | ICD-10-CM | POA: Diagnosis not present

## 2019-09-17 DIAGNOSIS — G5613 Other lesions of median nerve, bilateral upper limbs: Secondary | ICD-10-CM | POA: Diagnosis not present

## 2019-09-17 DIAGNOSIS — M25621 Stiffness of right elbow, not elsewhere classified: Secondary | ICD-10-CM | POA: Diagnosis not present

## 2019-09-17 DIAGNOSIS — M7711 Lateral epicondylitis, right elbow: Secondary | ICD-10-CM | POA: Diagnosis not present

## 2019-09-22 DIAGNOSIS — G5613 Other lesions of median nerve, bilateral upper limbs: Secondary | ICD-10-CM | POA: Diagnosis not present

## 2019-09-22 DIAGNOSIS — M25621 Stiffness of right elbow, not elsewhere classified: Secondary | ICD-10-CM | POA: Diagnosis not present

## 2019-09-22 DIAGNOSIS — M7711 Lateral epicondylitis, right elbow: Secondary | ICD-10-CM | POA: Diagnosis not present

## 2019-09-22 DIAGNOSIS — S53401A Unspecified sprain of right elbow, initial encounter: Secondary | ICD-10-CM | POA: Diagnosis not present

## 2019-09-25 DIAGNOSIS — E7800 Pure hypercholesterolemia, unspecified: Secondary | ICD-10-CM | POA: Diagnosis not present

## 2019-09-25 DIAGNOSIS — Z0000 Encounter for general adult medical examination without abnormal findings: Secondary | ICD-10-CM | POA: Diagnosis not present

## 2019-09-25 DIAGNOSIS — I10 Essential (primary) hypertension: Secondary | ICD-10-CM | POA: Diagnosis not present

## 2019-09-25 DIAGNOSIS — E1169 Type 2 diabetes mellitus with other specified complication: Secondary | ICD-10-CM | POA: Diagnosis not present

## 2019-10-03 DIAGNOSIS — Z79899 Other long term (current) drug therapy: Secondary | ICD-10-CM | POA: Diagnosis not present

## 2019-10-10 DIAGNOSIS — E1169 Type 2 diabetes mellitus with other specified complication: Secondary | ICD-10-CM | POA: Diagnosis not present

## 2019-10-10 DIAGNOSIS — E876 Hypokalemia: Secondary | ICD-10-CM | POA: Diagnosis not present

## 2019-10-29 DIAGNOSIS — M7711 Lateral epicondylitis, right elbow: Secondary | ICD-10-CM | POA: Diagnosis not present

## 2019-11-04 DIAGNOSIS — Z1211 Encounter for screening for malignant neoplasm of colon: Secondary | ICD-10-CM | POA: Diagnosis not present

## 2019-11-04 DIAGNOSIS — Z01818 Encounter for other preprocedural examination: Secondary | ICD-10-CM | POA: Diagnosis not present

## 2019-11-20 DIAGNOSIS — E119 Type 2 diabetes mellitus without complications: Secondary | ICD-10-CM | POA: Diagnosis not present

## 2019-12-22 DIAGNOSIS — M25561 Pain in right knee: Secondary | ICD-10-CM | POA: Diagnosis not present

## 2020-01-08 DIAGNOSIS — Z1159 Encounter for screening for other viral diseases: Secondary | ICD-10-CM | POA: Diagnosis not present

## 2020-01-13 DIAGNOSIS — K648 Other hemorrhoids: Secondary | ICD-10-CM | POA: Diagnosis not present

## 2020-01-13 DIAGNOSIS — Z1211 Encounter for screening for malignant neoplasm of colon: Secondary | ICD-10-CM | POA: Diagnosis not present

## 2020-01-13 DIAGNOSIS — K621 Rectal polyp: Secondary | ICD-10-CM | POA: Diagnosis not present

## 2020-01-13 DIAGNOSIS — K635 Polyp of colon: Secondary | ICD-10-CM | POA: Diagnosis not present

## 2020-02-06 DIAGNOSIS — M23331 Other meniscus derangements, other medial meniscus, right knee: Secondary | ICD-10-CM | POA: Diagnosis not present

## 2020-02-06 DIAGNOSIS — M25561 Pain in right knee: Secondary | ICD-10-CM | POA: Diagnosis not present

## 2020-02-14 DIAGNOSIS — M25561 Pain in right knee: Secondary | ICD-10-CM | POA: Diagnosis not present

## 2020-03-17 DIAGNOSIS — S83281A Other tear of lateral meniscus, current injury, right knee, initial encounter: Secondary | ICD-10-CM | POA: Diagnosis not present

## 2020-03-17 DIAGNOSIS — S83241A Other tear of medial meniscus, current injury, right knee, initial encounter: Secondary | ICD-10-CM | POA: Diagnosis not present

## 2020-03-23 DIAGNOSIS — S83241D Other tear of medial meniscus, current injury, right knee, subsequent encounter: Secondary | ICD-10-CM | POA: Diagnosis not present

## 2020-03-23 DIAGNOSIS — S83281D Other tear of lateral meniscus, current injury, right knee, subsequent encounter: Secondary | ICD-10-CM | POA: Diagnosis not present

## 2020-06-03 DIAGNOSIS — E7800 Pure hypercholesterolemia, unspecified: Secondary | ICD-10-CM | POA: Diagnosis not present

## 2020-06-03 DIAGNOSIS — I10 Essential (primary) hypertension: Secondary | ICD-10-CM | POA: Diagnosis not present

## 2020-06-03 DIAGNOSIS — E876 Hypokalemia: Secondary | ICD-10-CM | POA: Diagnosis not present

## 2020-06-03 DIAGNOSIS — E1169 Type 2 diabetes mellitus with other specified complication: Secondary | ICD-10-CM | POA: Diagnosis not present

## 2020-06-06 DIAGNOSIS — I4510 Unspecified right bundle-branch block: Secondary | ICD-10-CM | POA: Diagnosis not present

## 2020-06-06 DIAGNOSIS — R030 Elevated blood-pressure reading, without diagnosis of hypertension: Secondary | ICD-10-CM | POA: Diagnosis not present

## 2020-06-06 DIAGNOSIS — R519 Headache, unspecified: Secondary | ICD-10-CM | POA: Diagnosis not present

## 2020-06-06 DIAGNOSIS — Z79899 Other long term (current) drug therapy: Secondary | ICD-10-CM | POA: Diagnosis not present

## 2020-06-06 DIAGNOSIS — I10 Essential (primary) hypertension: Secondary | ICD-10-CM | POA: Diagnosis not present

## 2020-06-08 DIAGNOSIS — I10 Essential (primary) hypertension: Secondary | ICD-10-CM | POA: Diagnosis not present

## 2020-06-08 DIAGNOSIS — E876 Hypokalemia: Secondary | ICD-10-CM | POA: Diagnosis not present

## 2020-06-10 DIAGNOSIS — E876 Hypokalemia: Secondary | ICD-10-CM | POA: Diagnosis not present

## 2020-06-10 DIAGNOSIS — I10 Essential (primary) hypertension: Secondary | ICD-10-CM | POA: Diagnosis not present

## 2020-07-02 DIAGNOSIS — E876 Hypokalemia: Secondary | ICD-10-CM | POA: Diagnosis not present

## 2020-07-02 DIAGNOSIS — I10 Essential (primary) hypertension: Secondary | ICD-10-CM | POA: Diagnosis not present

## 2020-08-15 ENCOUNTER — Encounter (HOSPITAL_BASED_OUTPATIENT_CLINIC_OR_DEPARTMENT_OTHER): Payer: Self-pay | Admitting: Obstetrics and Gynecology

## 2020-08-15 ENCOUNTER — Other Ambulatory Visit: Payer: Self-pay

## 2020-08-15 ENCOUNTER — Emergency Department (HOSPITAL_BASED_OUTPATIENT_CLINIC_OR_DEPARTMENT_OTHER)
Admission: EM | Admit: 2020-08-15 | Discharge: 2020-08-15 | Disposition: A | Discharge status: Home / Self Care | Payer: BC Managed Care – PPO | Attending: Emergency Medicine | Admitting: Emergency Medicine

## 2020-08-15 ENCOUNTER — Emergency Department (HOSPITAL_BASED_OUTPATIENT_CLINIC_OR_DEPARTMENT_OTHER): Payer: BC Managed Care – PPO | Admitting: Radiology

## 2020-08-15 DIAGNOSIS — E119 Type 2 diabetes mellitus without complications: Secondary | ICD-10-CM | POA: Diagnosis not present

## 2020-08-15 DIAGNOSIS — W108XXA Fall (on) (from) other stairs and steps, initial encounter: Secondary | ICD-10-CM | POA: Insufficient documentation

## 2020-08-15 DIAGNOSIS — I10 Essential (primary) hypertension: Secondary | ICD-10-CM | POA: Insufficient documentation

## 2020-08-15 DIAGNOSIS — S99921A Unspecified injury of right foot, initial encounter: Secondary | ICD-10-CM | POA: Diagnosis not present

## 2020-08-15 DIAGNOSIS — S90111A Contusion of right great toe without damage to nail, initial encounter: Secondary | ICD-10-CM | POA: Diagnosis not present

## 2020-08-15 DIAGNOSIS — Z79899 Other long term (current) drug therapy: Secondary | ICD-10-CM | POA: Insufficient documentation

## 2020-08-15 DIAGNOSIS — S99929A Unspecified injury of unspecified foot, initial encounter: Secondary | ICD-10-CM

## 2020-08-15 MED ORDER — HYDROCODONE-ACETAMINOPHEN 7.5-325 MG PO TABS: 1.0000 | ORAL_TABLET | Freq: Once | ORAL | Status: DC

## 2020-08-15 MED ORDER — HYDROCODONE-ACETAMINOPHEN 5-325 MG PO TABS
1.5000 | ORAL_TABLET | Freq: Once | ORAL | Status: AC
  Administered 2020-08-15: 1.5 via ORAL
  Filled 2020-08-15: qty 2

## 2020-08-15 MED ORDER — HYDROCODONE-ACETAMINOPHEN 5-325 MG PO TABS: 1.0000 | ORAL_TABLET | Freq: Four times a day (QID) | ORAL | 0 refills | Status: DC | PRN

## 2020-08-15 NOTE — ED Notes (Signed)
Ice applied to patient's foot.

## 2020-08-15 NOTE — Discharge Instructions (Signed)
You have been seen and discharged from the emergency department.  Follow-up with your primary provider for reevaluation and further care. Take home medications as prescribed. If you have any worsening symptoms or further concerns for health please return to an emergency department for further evaluation.

## 2020-08-15 NOTE — ED Provider Notes (Signed)
[facility] EMERGENCY DEPT Provider Note   CSN: [id number] Arrival date & time: 08/15/20  3382     History Chief Complaint  Patient presents with  . Toe Pain    Jeffery Bailey is a 45 y.o. male.  HPI   45 year old male presents to the emergency department concern for right great toe injury.  Patient states last night while going upstairs he missed a step and stubbed his right great toe.  He felt okay overnight but this morning when he got up to walk he had pain with weightbearing.  He points at the base of the great toe where most of the pain is.  No other injury.  Past Medical History:  Diagnosis Date  . Diabetes mellitus without complication ([place])   . Hypertension   . Obesity     There are no problems to display for this patient.   Past Surgical History:  Procedure Laterality Date  . NASAL SEPTUM SURGERY    . TONSILLECTOMY         Family History  Problem Relation Age of Onset  . Hyperlipidemia Mother     Social History   Tobacco Use  . Smoking status: Never Smoker  . Smokeless tobacco: Never Used  Vaping Use  . Vaping Use: Never used  Substance Use Topics  . Alcohol use: Yes    Comment: Social  . Drug use: No    Home Medications Prior to Admission medications   Medication Sig Start Date End Date Taking? Authorizing Provider  azithromycin (ZITHROMAX Z-PAK) 250 MG tablet Take as directed on pack 09/27/14   Kindl, Nelda Severe, MD  cephALEXin (KEFLEX) 500 MG capsule Take 1 capsule (500 mg total) by mouth 4 (four) times daily. 11/26/15   Joy, Shawn C, PA-C  chlorpheniramine-HYDROcodone (TUSSIONEX PENNKINETIC ER) 10-8 MG/5ML SUER Take 5 mLs by mouth every 12 (twelve) hours as needed for cough. 09/27/14   Billy Fischer, MD  lisinopril-hydrochlorothiazide (PRINZIDE,ZESTORETIC) 10-12.5 MG per tablet Take 1 tablet by mouth daily.    [provider]  naproxen (NAPROSYN) 500 MG tablet Take 1 tablet (500 mg total) by mouth 2 (two) times daily.  11/26/15   Joy, Shawn C, PA-C  Nebivolol HCl (BYSTOLIC) 20 MG TABS Take 20 mg by mouth daily.    [provider]    Allergies    Patient has no known allergies.  Review of Systems   Review of Systems  Constitutional: Negative for fever.  Respiratory: Negative for shortness of breath.   Cardiovascular: Negative for chest pain.  Gastrointestinal: Negative for abdominal pain.  Musculoskeletal:       + Right great toe injury  Neurological: Negative for headaches.    Physical Exam Updated Vital Signs BP (!) 168/92 (BP Location: Right Arm)   Pulse 69   Temp 98.5 F (36.9 C) (Oral)   Resp 18   Ht 6\' 2"  (1.88 m)   Wt 122.5 kg   SpO2 100%   BMI 34.67 kg/m   Physical Exam Vitals and nursing note reviewed.  Constitutional:      Appearance: Normal appearance.  HENT:     Head: Normocephalic.     Mouth/Throat:     Mouth: Mucous membranes are moist.  Cardiovascular:     Rate and Rhythm: Normal rate.  Pulmonary:     Effort: Pulmonary effort is normal. No respiratory distress.  Abdominal:     Palpations: Abdomen is soft.  Musculoskeletal:     Comments: Mild swelling and bruising  at the base of the right great toe that is tender to palpation, capillary refill is normal, nail is intact, no hematoma, no midfoot or ankle pain  Skin:    General: Skin is warm.  Neurological:     Mental Status: He is alert and oriented to person, place, and time. Mental status is at baseline.  Psychiatric:        Mood and Affect: Mood normal.     ED Results / Procedures / Treatments   Labs (all labs ordered are listed, but only abnormal results are displayed) Labs Reviewed - No data to display  EKG None  Radiology No results found.  Procedures Procedures   Medications Ordered in ED Medications  HYDROcodone-acetaminophen (NORCO/VICODIN) 5-325 MG per tablet 1.5 tablet (has no administration in time range)    ED Course  I have reviewed the triage vital signs and the nursing  notes.  Pertinent labs & imaging results that were available during my care of the patient were reviewed by me and considered in my medical decision making (see chart for details).    MDM Rules/Calculators/A&P                          45 year old male presents emergency department right great toe injury.  X-ray no acute fracture.  We will treat his pain symptomatically and refer outpatient.  Patient will be discharged and treated as an outpatient.  Discharge plan and strict return to ED precautions discussed, patient verbalizes understanding and agreement.  Final Clinical Impression(s) / ED Diagnoses Final diagnoses:  Toe injury    Rx / DC Orders ED Discharge Orders    None       Lorelle Gibbs, DO 08/15/20 1030

## 2020-08-15 NOTE — ED Triage Notes (Signed)
Patient reports to the ER for right great toe injury. Patient reports he tripped up some stairs and is having pain with walking.

## 2020-08-18 DIAGNOSIS — M79671 Pain in right foot: Secondary | ICD-10-CM | POA: Diagnosis not present

## 2020-08-23 ENCOUNTER — Ambulatory Visit: Payer: 59 | Admitting: Internal Medicine

## 2020-10-03 ENCOUNTER — Emergency Department (HOSPITAL_BASED_OUTPATIENT_CLINIC_OR_DEPARTMENT_OTHER)
Admission: EM | Admit: 2020-10-03 | Discharge: 2020-10-03 | Disposition: A | Discharge status: Home / Self Care | Payer: BC Managed Care – PPO | Attending: Emergency Medicine | Admitting: Emergency Medicine

## 2020-10-03 ENCOUNTER — Other Ambulatory Visit: Payer: Self-pay

## 2020-10-03 ENCOUNTER — Encounter (HOSPITAL_BASED_OUTPATIENT_CLINIC_OR_DEPARTMENT_OTHER): Payer: Self-pay

## 2020-10-03 ENCOUNTER — Emergency Department (HOSPITAL_BASED_OUTPATIENT_CLINIC_OR_DEPARTMENT_OTHER): Payer: BC Managed Care – PPO

## 2020-10-03 DIAGNOSIS — W1789XA Other fall from one level to another, initial encounter: Secondary | ICD-10-CM | POA: Insufficient documentation

## 2020-10-03 DIAGNOSIS — E119 Type 2 diabetes mellitus without complications: Secondary | ICD-10-CM | POA: Insufficient documentation

## 2020-10-03 DIAGNOSIS — W108XXA Fall (on) (from) other stairs and steps, initial encounter: Secondary | ICD-10-CM

## 2020-10-03 DIAGNOSIS — S99921A Unspecified injury of right foot, initial encounter: Secondary | ICD-10-CM | POA: Diagnosis not present

## 2020-10-03 DIAGNOSIS — I10 Essential (primary) hypertension: Secondary | ICD-10-CM | POA: Diagnosis not present

## 2020-10-03 DIAGNOSIS — Z79899 Other long term (current) drug therapy: Secondary | ICD-10-CM | POA: Diagnosis not present

## 2020-10-03 DIAGNOSIS — S92351A Displaced fracture of fifth metatarsal bone, right foot, initial encounter for closed fracture: Secondary | ICD-10-CM | POA: Diagnosis not present

## 2020-10-03 DIAGNOSIS — S92901A Unspecified fracture of right foot, initial encounter for closed fracture: Secondary | ICD-10-CM | POA: Diagnosis not present

## 2020-10-03 DIAGNOSIS — S92355A Nondisplaced fracture of fifth metatarsal bone, left foot, initial encounter for closed fracture: Secondary | ICD-10-CM | POA: Diagnosis not present

## 2020-10-03 MED ORDER — ACETAMINOPHEN ER 650 MG PO TBCR: 650.0000 mg | EXTENDED_RELEASE_TABLET | Freq: Four times a day (QID) | ORAL | 0 refills | Status: DC

## 2020-10-03 MED ORDER — HYDROCODONE-ACETAMINOPHEN 5-325 MG PO TABS: 1.0000 | ORAL_TABLET | Freq: Three times a day (TID) | ORAL | 0 refills | Status: AC | PRN

## 2020-10-03 MED ORDER — HYDROCODONE-ACETAMINOPHEN 5-325 MG PO TABS
1.0000 | ORAL_TABLET | Freq: Once | ORAL | Status: AC
  Administered 2020-10-03: 1 via ORAL
  Filled 2020-10-03: qty 1

## 2020-10-03 NOTE — Discharge Instructions (Addendum)
You have a fracture of the bone that connects the toes to the foot.  We have given you a cam walker boot and crutches.  No weightbearing until cleared by orthopedics.  Call the orthopedist at the number provided to set up an appointment in 10 to 14 days.

## 2020-10-03 NOTE — ED Triage Notes (Signed)
Pt fell off some attic stairs approx 6 feet and injured his L foot. Pt denies any head trauma or LOC. Denies any other injury.

## 2020-10-03 NOTE — ED Provider Notes (Signed)
[facility] EMERGENCY DEPT Provider Note   CSN: [id number] Arrival date & time: 10/03/20  0846     History Chief Complaint  Patient presents with  . Fall    Jeffery Bailey is a 45 y.o. male.  HPI    45 year old male with history of diabetes, hypertension comes in with chief complaint of fall.  Patient fell from about 10 feet height, as the stairs to the attic gave away.  He fell onto his left foot and is having pain over the lateral aspect of the foot, just below the small digit.  Patient's ankle is fine.  He has discomfort with any type of activity/ambulation attempt.  Patient denies head trauma and has no headache, loss of consciousness, confusion, nausea and he also denies trauma elsewhere to his body and any chest pain, shortness of breath  Past Medical History:  Diagnosis Date  . Diabetes mellitus without complication ([place])   . Hypertension   . Obesity     There are no problems to display for this patient.   Past Surgical History:  Procedure Laterality Date  . NASAL SEPTUM SURGERY    . TONSILLECTOMY         Family History  Problem Relation Age of Onset  . Hyperlipidemia Mother     Social History   Tobacco Use  . Smoking status: Never Smoker  . Smokeless tobacco: Never Used  Vaping Use  . Vaping Use: Never used  Substance Use Topics  . Alcohol use: Yes    Comment: Social  . Drug use: No    Home Medications Prior to Admission medications   Medication Sig Start Date End Date Taking? Authorizing Provider  acetaminophen (TYLENOL 8 HOUR) 650 MG CR tablet Take 1 tablet (650 mg total) by mouth every 6 (six) hours. 10/03/20  Yes Varney Biles, MD  HYDROcodone-acetaminophen (NORCO/VICODIN) 5-325 MG tablet Take 1 tablet by mouth every 8 (eight) hours as needed for up to 3 days for severe pain. 10/03/20 10/06/20 Yes Varney Biles, MD  azithromycin (ZITHROMAX Z-PAK) 250 MG tablet Take as directed on pack 09/27/14   Billy Fischer, MD   cephALEXin (KEFLEX) 500 MG capsule Take 1 capsule (500 mg total) by mouth 4 (four) times daily. 11/26/15   Joy, Shawn C, PA-C  chlorpheniramine-HYDROcodone (TUSSIONEX PENNKINETIC ER) 10-8 MG/5ML SUER Take 5 mLs by mouth every 12 (twelve) hours as needed for cough. 09/27/14   Billy Fischer, MD  lisinopril-hydrochlorothiazide (PRINZIDE,ZESTORETIC) 10-12.5 MG per tablet Take 1 tablet by mouth daily.    [provider]  naproxen (NAPROSYN) 500 MG tablet Take 1 tablet (500 mg total) by mouth 2 (two) times daily. 11/26/15   Joy, Shawn C, PA-C  Nebivolol HCl (BYSTOLIC) 20 MG TABS Take 20 mg by mouth daily.    [provider]    Allergies    Patient has no known allergies.  Review of Systems   Review of Systems  Constitutional: Positive for activity change.  Respiratory: Negative for shortness of breath.   Cardiovascular: Negative for chest pain.  Musculoskeletal: Positive for arthralgias.  Neurological: Negative for headaches.    Physical Exam Updated Vital Signs Ht 6\' 2"  (1.88 m)   Wt 123.4 kg   BMI 34.92 kg/m   Physical Exam Vitals and nursing note reviewed.  Constitutional:      Appearance: He is well-developed.  HENT:     Head: Atraumatic.  Cardiovascular:     Rate and Rhythm: Normal rate.  Pulmonary:  Effort: Pulmonary effort is normal.  Musculoskeletal:        General: Swelling and tenderness present. No deformity.     Cervical back: Neck supple.     Comments: Tenderness just proximal to the fifth small digit over the metatarsal region -left side  Skin:    General: Skin is warm.  Neurological:     Mental Status: He is alert and oriented to person, place, and time.     ED Results / Procedures / Treatments   Labs (all labs ordered are listed, but only abnormal results are displayed) Labs Reviewed - No data to display  EKG None  Radiology DG Foot Complete Left  Result Date: 10/03/2020 CLINICAL DATA:  45 year old male with left foot pain after  fall down stairs yesterday. EXAM: LEFT FOOT - COMPLETE 3+ VIEW COMPARISON:  None. FINDINGS: Comminuted nondisplaced fracture involving the proximal metadiaphysis and shaft of the left 5th metatarsal. Fracture lucency extends to the 5th TMT joint which appears maintained. Normal underlying bone mineralization. Other osseous structures, joint spaces and alignment appear normal. IMPRESSION: Comminuted and intra-articular but nondisplaced fracture of the proximal left 5th metatarsal. Electronically Signed   By: Genevie Ann M.D.   On: 10/03/2020 09:58    Procedures Procedures   Medications Ordered in ED Medications  HYDROcodone-acetaminophen (NORCO/VICODIN) 5-325 MG per tablet 1 tablet (has no administration in time range)    ED Course  I have reviewed the triage vital signs and the nursing notes.  Pertinent labs & imaging results that were available during my care of the patient were reviewed by me and considered in my medical decision making (see chart for details).    MDM Rules/Calculators/A&P                          45 year old comes in a chief complaint of fall.  He is noted to have metatarsal fracture.  We will put him in cam walker boot and recommend nonambulatory status until cleared by orthopedist.  Final Clinical Impression(s) / ED Diagnoses Final diagnoses:  Fall (on) (from) other stairs and steps, initial encounter  Closed fracture of right foot, initial encounter    Rx / DC Orders ED Discharge Orders         Ordered    HYDROcodone-acetaminophen (NORCO/VICODIN) 5-325 MG tablet  Every 8 hours PRN        10/03/20 1037    acetaminophen (TYLENOL 8 HOUR) 650 MG CR tablet  Every 6 hours        10/03/20 [place], Laela Deviney, MD 10/03/20 1040

## 2020-10-06 DIAGNOSIS — S92355A Nondisplaced fracture of fifth metatarsal bone, left foot, initial encounter for closed fracture: Secondary | ICD-10-CM | POA: Diagnosis not present

## 2020-10-12 DIAGNOSIS — S92355A Nondisplaced fracture of fifth metatarsal bone, left foot, initial encounter for closed fracture: Secondary | ICD-10-CM | POA: Diagnosis not present

## 2020-10-12 DIAGNOSIS — S92355D Nondisplaced fracture of fifth metatarsal bone, left foot, subsequent encounter for fracture with routine healing: Secondary | ICD-10-CM | POA: Diagnosis not present

## 2020-10-12 DIAGNOSIS — X58XXXA Exposure to other specified factors, initial encounter: Secondary | ICD-10-CM | POA: Diagnosis not present

## 2020-10-12 DIAGNOSIS — G8918 Other acute postprocedural pain: Secondary | ICD-10-CM | POA: Diagnosis not present

## 2020-10-12 DIAGNOSIS — Y999 Unspecified external cause status: Secondary | ICD-10-CM | POA: Diagnosis not present

## 2020-11-08 ENCOUNTER — Encounter: Payer: Self-pay | Admitting: Internal Medicine

## 2020-11-08 ENCOUNTER — Other Ambulatory Visit: Payer: Self-pay

## 2020-11-08 ENCOUNTER — Ambulatory Visit (INDEPENDENT_AMBULATORY_CARE_PROVIDER_SITE_OTHER): Payer: BC Managed Care – PPO | Admitting: Internal Medicine

## 2020-11-08 VITALS — BP 150/90 | HR 76 | Ht 74.0 in | Wt 275.0 lb

## 2020-11-08 DIAGNOSIS — I10 Essential (primary) hypertension: Secondary | ICD-10-CM

## 2020-11-08 NOTE — Progress Notes (Addendum)
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 is a 45 y.o. male with a past medical history of T2DM, HTN, Dyslipidemia and hx of renal stones. The patient presented for initial endocrinology clinic visit on 11/08/2020 for consultative assistance with his resistant HTN.   Pt with uncontrolled HTN for > 12-13 yrs, despite being on multiple antihypertensive meds to include hydralazine, spironolactone, bystolic, amlodipine and losartan. Of note, the pt has hx of hypokalemia and is on KCL.   Hydralazine was stopped due to headaches   He denies any more headaches  Denies lE swelling   Has a left foot boot due to a fracture , fell down the attic  Denies SOB and chest pain    Mother with HTN and renal stones  Father with SM    Bystolic  Lisinoril-HCTZ  Amlodipine  KCL daily    Wife on the phone Terry   HISTORY:  Past Medical History:  Past Medical History:  Diagnosis Date   Diabetes mellitus without complication ([place])    Hypertension    Obesity    Past Surgical History:  Past Surgical History:  Procedure Laterality Date   NASAL SEPTUM SURGERY     TONSILLECTOMY      Social History:  reports that he has never smoked. He has never used smokeless tobacco. He reports current alcohol use. He reports that he does not use drugs. Family History: family history includes Diabetes in his father; Hyperlipidemia in his mother.   HOME MEDICATIONS: Allergies as of 11/08/2020   No Known Allergies      Medication List        Accurate as of November 08, 2020 11:04 AM. If you have any questions, ask your nurse or doctor.          STOP taking these medications    azithromycin 250 MG tablet Commonly known as: Zithromax Z-Pak Stopped by: Dorita Sciara, MD    cephALEXin 500 MG capsule Commonly known as: KEFLEX Stopped by: Dorita Sciara, MD   chlorpheniramine-HYDROcodone 10-8 MG/5ML Suer Commonly known as: Tussionex Pennkinetic ER Stopped by: Dorita Sciara, MD       TAKE these medications    acetaminophen 650 MG CR tablet Commonly known as: Tylenol 8 Hour Take 1 tablet (650 mg total) by mouth every 6 (six) hours.   lisinopril-hydrochlorothiazide 10-12.5 MG tablet Commonly known as: ZESTORETIC Take 1 tablet by mouth daily.   naproxen 500 MG tablet Commonly known as: NAPROSYN Take 1 tablet (500 mg total) by mouth 2 (two) times daily.   Nebivolol HCl 20 MG Tabs Take 20 mg by mouth daily.          REVIEW OF SYSTEMS: A comprehensive ROS was conducted with the patient and is negative except as per HPI    OBJECTIVE:  VS: BP (!) 150/90   Pulse 76   Ht 6\' 2"  (1.88 m)   Wt 275 lb (124.7 kg)   SpO2 99%   BMI 35.31 kg/m    Wt Readings from Last 3 Encounters:  11/08/20 275 lb (124.7 kg)  10/03/20 272 lb (123.4 kg)  08/15/20 270 lb (122.5 kg)     EXAM: General: Pt appears well and is in NAD  Neck: General:  Supple without adenopathy. Thyroid: Thyroid size normal.  No goiter or nodules appreciated.   Lungs: Clear with good BS bilat with no rales, rhonchi, or wheezes  Heart: Auscultation: RRR.  Abdomen: Normoactive bowel sounds, soft, nontender, without masses or organomegaly palpable  Extremities:  BL LE: No pretibial edema normal ROM and strength.  Neuro: Cranial nerves: II - XII grossly intact  Motor: Normal strength throughout DTRs: 2+ and symmetric in UE without delay in relaxation phase  Mental Status: Judgment, insight: Intact Orientation: Oriented to time, place, and person Mood and affect: No depression, anxiety, or agitation     DATA REVIEWED:  07/02/2020  BUN/Cr 12/1.23  GFR 76 K 4.2  Cl 29 Ca 10.0   06/10/2020 Aldo: Renin ratio >79.6 Aldo 13.3 Renin activity < 0.167    24- hr  urine  Dopamine urine 152 ug (< 510) Epi 2 ( < 20) Norepi 92 ( < 135)  Meta 170 (<276) Normeta 598 ( <729)  A1c 8.0%    ASSESSMENT/PLAN/RECOMMENDATIONS:   HTN:   - High suspicion for hyperaldosteronemia  -He initially forgot to bring his medication list, patient will call back with an updated medication list -Pheochromocytoma has been ruled out, we will proceed with 24-hour urinary cortisol check for Cushing's syndrome, he understand if cortisol excretion is normal we will proceed with saline loading for confirmatory test of primary hyper Aldo. -We discussed the need to control his BP prior to proceeding with any further testing -He also understands the need to be off spironolactone for at least 6 weeks prior to saline loading   Medications : Patient did not bring his medication list today and was not able to provide detailed information about this patient stated he will call right back with his medication list     I called pt  on 11/10/2020 at 1310 to obtain his antihypertensive medication list, the patient stated he is on his way to the office.  I again emphasized the importance of stopping his spironolactone and the need to adjust the rest of his medications due to uncontrolled BP, I explained to him I would not be able to make any adjustments as long as I am not sure what he is taking.  Patient expressed understanding     Addendum: Received medication list 6/302022 Spironolactone 25 mg daily KCL 20 mEq daily  Losartan 100 mg daily  Jardiance 10 mg daily  Janumet 50/1000 two tabs daily  Bystolic 25 mg daily  Amlodipine 10 mg daily  Rosuvastatin 20 mg daily  Hydralazine 50 mg TID   New recommendations STOP Spironolactone  Increase Hydralazine to 100 mg TID   Discussed these recommendations with the pt on 11/11/2020 at 1230  Signed electronically by: Mack Guise, MD  [facility] End[facility]  [facility] [address] [place], [place] [address] Phone: [phone] FAX: [phone]   CC: Seward Carol, [address] [address] Phone: [phone] Fax: [phone]   Return to Endocrinology clinic as below: No future appointments.

## 2020-11-08 NOTE — Patient Instructions (Signed)

## 2020-11-10 ENCOUNTER — Telehealth: Payer: Self-pay | Admitting: Internal Medicine

## 2020-11-10 NOTE — Telephone Encounter (Signed)
Patient requests to be called at ph# [phone] to be advised re: Medication that sounds like Spectorin (Patient states he is not sure if he is supposed to take the above medication or to stop taking the above medication). Patient states he will drop off a current medication list later today.

## 2020-11-11 ENCOUNTER — Other Ambulatory Visit: Payer: Self-pay | Admitting: *Deleted

## 2020-11-11 MED ORDER — HYDRALAZINE HCL 100 MG PO TABS: 100.0000 mg | ORAL_TABLET | Freq: Three times a day (TID) | ORAL | 6 refills | Status: DC

## 2020-11-11 NOTE — Addendum Note (Signed)
Addended by: Dorita Sciara on: 11/11/2020 12:34 PM   Modules accepted: Orders

## 2020-11-11 NOTE — Telephone Encounter (Signed)
Called  pt--stated already spoke to Dr. Kelton Pillar regarding medication.

## 2020-11-16 ENCOUNTER — Other Ambulatory Visit: Payer: BC Managed Care – PPO

## 2020-11-16 ENCOUNTER — Other Ambulatory Visit: Payer: Self-pay

## 2020-11-16 DIAGNOSIS — I10 Essential (primary) hypertension: Secondary | ICD-10-CM

## 2020-11-22 DIAGNOSIS — I10 Essential (primary) hypertension: Secondary | ICD-10-CM | POA: Diagnosis not present

## 2020-11-22 DIAGNOSIS — E7800 Pure hypercholesterolemia, unspecified: Secondary | ICD-10-CM | POA: Diagnosis not present

## 2020-11-22 DIAGNOSIS — E1169 Type 2 diabetes mellitus with other specified complication: Secondary | ICD-10-CM | POA: Diagnosis not present

## 2020-11-23 LAB — CORTISOL, URINE, 24 HOUR
24 Hour urine volume (VMAHVA): 4050 mL
CREATININE, URINE: 2.62 g/(24.h) — ABNORMAL HIGH (ref 0.50–2.15)
Cortisol (Ur), Free: 51 mcg/24 h — ABNORMAL HIGH (ref 4.0–50.0)

## 2020-11-24 DIAGNOSIS — S92355A Nondisplaced fracture of fifth metatarsal bone, left foot, initial encounter for closed fracture: Secondary | ICD-10-CM | POA: Diagnosis not present

## 2020-11-29 ENCOUNTER — Telehealth: Payer: Self-pay | Admitting: Internal Medicine

## 2020-11-29 DIAGNOSIS — I10 Essential (primary) hypertension: Secondary | ICD-10-CM

## 2020-11-29 MED ORDER — DEXAMETHASONE 1 MG PO TABS: 1.0000 mg | ORAL_TABLET | Freq: Once | ORAL | 0 refills | Status: AC

## 2020-11-29 NOTE — Telephone Encounter (Signed)
Discussed slight elevation in cortisol on 24- hr urine testing on 11/29/2020  Results for Jeffery Bailey, Jeffery Bailey (MRN [id number]) as of 11/29/2020 11:10  Ref. Range 11/16/2020 08:09  Cortisol (Ur), Free Latest Ref Range: 4.0 - 50.0 mcg/24 h 51.0 (H)    Will proceed with Dexamethasone suppression test , pt will stop by the lab tomorrow at [facility] located at [address] [place]     Instructions for Dexamethasone Suppression Test   Step 1: Choose a morning when you can come to our lab at 8:00 am for a blood draw.   Step 2: On the night before the blood draw, take one 1 mg tablet of dexamethasone at 11:30 pm.  The timing is VERY important!   Step 3: The next morning, go to the lab for blood work at 8:00 am.  Dennis Bast do not have to be on an empty stomach, but the timing is VERY important!  Pt expressed understanding   Abby Nena Jordan, MD  [facility] Endocrinology  [facility] [address] [place],  [address] Phone: [phone] FAX: [phone]

## 2020-11-30 ENCOUNTER — Other Ambulatory Visit (INDEPENDENT_AMBULATORY_CARE_PROVIDER_SITE_OTHER): Payer: BC Managed Care – PPO

## 2020-11-30 DIAGNOSIS — I10 Essential (primary) hypertension: Secondary | ICD-10-CM | POA: Diagnosis not present

## 2020-11-30 LAB — CORTISOL: Cortisol, Plasma: 1.2 ug/dL

## 2020-12-01 ENCOUNTER — Encounter: Payer: Self-pay | Admitting: Internal Medicine

## 2020-12-15 DIAGNOSIS — Z20822 Contact with and (suspected) exposure to covid-19: Secondary | ICD-10-CM | POA: Diagnosis not present

## 2020-12-21 DIAGNOSIS — Z20822 Contact with and (suspected) exposure to covid-19: Secondary | ICD-10-CM | POA: Diagnosis not present

## 2021-01-07 ENCOUNTER — Ambulatory Visit (INDEPENDENT_AMBULATORY_CARE_PROVIDER_SITE_OTHER): Payer: BC Managed Care – PPO | Admitting: Internal Medicine

## 2021-01-07 ENCOUNTER — Encounter: Payer: Self-pay | Admitting: Internal Medicine

## 2021-01-07 ENCOUNTER — Telehealth: Payer: Self-pay | Admitting: Internal Medicine

## 2021-01-07 ENCOUNTER — Other Ambulatory Visit: Payer: Self-pay

## 2021-01-07 VITALS — BP 146/92 | HR 68 | Ht 74.0 in | Wt 274.0 lb

## 2021-01-07 DIAGNOSIS — E876 Hypokalemia: Secondary | ICD-10-CM | POA: Diagnosis not present

## 2021-01-07 DIAGNOSIS — I10 Essential (primary) hypertension: Secondary | ICD-10-CM | POA: Diagnosis not present

## 2021-01-07 DIAGNOSIS — E269 Hyperaldosteronism, unspecified: Secondary | ICD-10-CM | POA: Diagnosis not present

## 2021-01-07 LAB — BASIC METABOLIC PANEL
BUN: 13 mg/dL (ref 6–23)
CO2: 25 mEq/L (ref 19–32)
Calcium: 9.5 mg/dL (ref 8.4–10.5)
Chloride: 104 mEq/L (ref 96–112)
Creatinine, Ser: 1.08 mg/dL (ref 0.40–1.50)
GFR: 81.7 mL/min (ref >60.00)
Glucose, Bld: 165 mg/dL — ABNORMAL HIGH (ref 70–99)
Potassium: 3.2 mEq/L — ABNORMAL LOW (ref 3.5–5.1)
Sodium: 140 mEq/L (ref 135–145)

## 2021-01-07 MED ORDER — POTASSIUM CHLORIDE CRYS ER 20 MEQ PO TBCR: 20.0000 meq | EXTENDED_RELEASE_TABLET | Freq: Three times a day (TID) | ORAL | 1 refills | Status: DC

## 2021-01-07 MED ORDER — DILTIAZEM HCL ER 180 MG PO CP24: 180.0000 mg | ORAL_CAPSULE | Freq: Every day | ORAL | 3 refills | Status: DC

## 2021-01-07 NOTE — Telephone Encounter (Signed)
Please let him know his potassium is low , needs to increase KCL to Three tablets a day ( he either take them together or separate )   Please schedule him for repeat labs in 2 weeks   Please also let him know I am going to order CT scan of the abdomen to look for adrenal tumor    Thanks  Abby Nena Jordan, MD  [facility] Endocrinology  [facility] [address] [place], [place] [address] Phone: [place]: [phone]

## 2021-01-07 NOTE — Progress Notes (Signed)
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 He has followed with [place] [facility] since 10/19/2020 for consultative assistance with management of his resistant HTN.   HISTORICAL SUMMARY:  Pt with uncontrolled HTN for > 12-13 yrs ago, despite being on multiple antihypertensive meds to include hydralazine, spironolactone, bystolic, amlodipine and losartan. Of note, the pt has hx of hypokalemia and is on KCL.    His 24- hr urinary dopamine, catecholamine and metanephrines were normal. He has a slightly elevated urinary cortisol at 51 mcg/24 hr but dexamethasone test came back negative with a cortisol of 1.2 ug/dL.    Mother with HTN and renal stones  Father with SM       SUBJECTIVE:    Today (01/07/2021):  Jeffery Bailey is here for hypertension .  No recent BP checks at home  Denies headaches or blurry vision  Denies chest or SOB  Denies LE edema      KCL 20 mEq daily  Losartan 123XX123 mg daily  Bystolic 20 mg two tabs daily  Amlodipine 10 mg daily  Hydralazine 100 mg TID       HISTORY:  Past Medical History:  Past Medical History:  Diagnosis Date   Diabetes mellitus without complication ([place])    Hypertension    Obesity    Past Surgical History:  Past Surgical History:  Procedure Laterality Date   NASAL SEPTUM SURGERY     TONSILLECTOMY     Social History:  reports that he has never smoked. He has never used smokeless tobacco. He reports current alcohol use. He reports that he does not use drugs. Family History:  Family History  Problem Relation Age of Onset   Hyperlipidemia Mother    Diabetes Father      HOME MEDICATIONS: Allergies as  of 01/07/2021   No Known Allergies      Medication List        Accurate as of January 07, 2021  2:16 PM. If you have any questions, ask your nurse or doctor.          STOP taking these medications    amLODipine 10 MG tablet Commonly known as: NORVASC Stopped by: Dorita Sciara, MD   sitaGLIPtin-metformin 50-1000 MG tablet Commonly known as: JANUMET Stopped by: Dorita Sciara, MD       TAKE these medications    diltiazem 180 MG 24 hr capsule Commonly known as: DILACOR XR Take 1 capsule (180 mg total) by mouth daily. Started by: Dorita Sciara, MD   empagliflozin 10 MG Tabs tablet Commonly known as: JARDIANCE Take by mouth daily.   hydrALAZINE 100 MG tablet Commonly known as: APRESOLINE Take 1 tablet (100 mg total) by mouth 3 (three) times daily.   losartan 100 MG tablet Commonly known as: COZAAR Take 100 mg by mouth daily.   metFORMIN 1000 MG tablet Commonly known as: GLUCOPHAGE metformin 1,000 mg tablet  TAKE 1 TABLET BY MOUTH TWICE DAILY WITH A MEAL   Nebivolol HCl 20 MG Tabs Take 40 mg by mouth daily in the afternoon.   potassium chloride SA 20 MEQ tablet Commonly known as: KLOR-CON  Take 1 tablet (20 mEq total) by mouth 3 (three) times daily. What changed: when to take this Changed by: Dorita Sciara, MD   rosuvastatin 20 MG tablet Commonly known as: CRESTOR Take 20 mg by mouth daily.          OBJECTIVE:   PHYSICAL EXAM: VS: BP (!) 146/92   Pulse 68   Ht '6\' 2"'$  (1.88 m)   Wt 274 lb (124.3 kg)   SpO2 95%   BMI 35.18 kg/m    EXAM: General: Pt appears well and is in NAD  Neck: General: Supple without adenopathy. Thyroid: Thyroid size normal.  No goiter or nodules appreciated. No thyroid bruit.  Lungs: Clear with good BS bilat with no rales, rhonchi, or wheezes  Heart: Auscultation: RRR.  Abdomen: Normoactive bowel sounds, soft, nontender, without masses or organomegaly palpable  Extremities:  BL LE: No  pretibial edema normal ROM and strength.  Mental Status: Judgment, insight: Intact Orientation: Oriented to time, place, and person Mood and affect: No depression, anxiety, or agitation     DATA REVIEWED: Results for Jeffery Bailey, Jeffery Bailey (MRN [id number]) as of 01/07/2021 14:16  Ref. Range 01/07/2021 08:57  Sodium Latest Ref Range: 135 - 145 mEq/L 140  Potassium Latest Ref Range: 3.5 - 5.1 mEq/L 3.2 (L)  Chloride Latest Ref Range: 96 - 112 mEq/L 104  CO2 Latest Ref Range: 19 - 32 mEq/L 25  Glucose Latest Ref Range: 70 - 99 mg/dL 165 (H)  BUN Latest Ref Range: 6 - 23 mg/dL 13  Creatinine Latest Ref Range: 0.40 - 1.50 mg/dL 1.08  Calcium Latest Ref Range: 8.4 - 10.5 mg/dL 9.5  GFR Latest Ref Range: >60.00 mL/min 81.70    07/02/2020   BUN/Cr 12/1.23  GFR 76 K 4.2  Cl 29 Ca 10.0     06/10/2020 Aldo: Renin ratio >79.6 Aldo 13.3 Renin activity < 0.167      24- hr urine  Dopamine urine 152 ug (< 510) Epi 2 ( < 20) Norepi 92 ( < 135)  Meta 170 (<276) Normeta 598 ( <729)  A1c 8.0%     ASSESSMENT / PLAN / RECOMMENDATIONS:   Hyperaldosteronemia :  - Labs through PCP's office show suppressed renin  with an Aldosterone of 13.3  - His BP continues to be elevated , will switch Amlodipine to Diltiazem  - Will proceed with adrenal imaging    Medications  STOP Amlodipine  Start Diltiazem 180 mg daily  Losartan 123XX123 mg daily  Bystolic 20 mg two tabs daily  Hydralazine 100 mg TID    2. Hypokalemia :  - Will replenish as below   Medication  Increase Kcl 20 mEq to Three tabs daily  Recheck potassium  in 2 weeks     Signed electronically by: Mack Guise, MD  [facility] Endocrinology  [place] [facility] [address] [place], [place] [address] Phone: [phone] FAX: [phone]      CC: Seward Carol, [place] [address] [address] Phone: [phone]  Fax: [phone]   Return to Endocrinology clinic as  below: Future Appointments  Date Time Provider [place]  03/07/2021  7:50 AM Hridaan Bouse, Melanie Crazier, MD [facility] None

## 2021-01-07 NOTE — Telephone Encounter (Signed)
Notified pt --lab results, medication and CT scan instructions by Dr. Kelton Pillar. Pt Voiced understanding.

## 2021-01-07 NOTE — Patient Instructions (Addendum)
STOP Amlodipine  Start Diltiazem 180 mg daily  KCL 20 mEq daily  Losartan 123XX123 mg daily  Bystolic 20 mg two tabs daily  Hydralazine 100 mg TID

## 2021-01-10 ENCOUNTER — Other Ambulatory Visit: Payer: Self-pay

## 2021-01-10 ENCOUNTER — Other Ambulatory Visit (INDEPENDENT_AMBULATORY_CARE_PROVIDER_SITE_OTHER): Payer: BC Managed Care – PPO

## 2021-01-10 DIAGNOSIS — I10 Essential (primary) hypertension: Secondary | ICD-10-CM

## 2021-01-10 LAB — POTASSIUM: Potassium: 3.3 mEq/L — ABNORMAL LOW (ref 3.5–5.1)

## 2021-01-11 LAB — ALDOSTERONE + RENIN ACTIVITY W/ RATIO
ALDOS/RENIN RATIO: >134.7 — ABNORMAL HIGH (ref 0.0–30.0)
ALDOSTERONE: 22.5 ng/dL (ref 0.0–30.0)
Renin: <0.167 ng/mL/hr — ABNORMAL LOW (ref 0.167–5.380)

## 2021-01-12 ENCOUNTER — Telehealth: Payer: Self-pay | Admitting: Internal Medicine

## 2021-01-12 DIAGNOSIS — E269 Hyperaldosteronism, unspecified: Secondary | ICD-10-CM | POA: Insufficient documentation

## 2021-01-12 NOTE — Telephone Encounter (Signed)
Results for Jeffery Bailey, Jeffery Bailey (MRN [id number]) as of 01/12/2021 15:25  Ref. Range 01/07/2021 08:57  ALDOSTERONE Latest Ref Range: 0.0 - 30.0 ng/dL 22.5  Renin Latest Ref Range: 0.167 - 5.380 ng/mL/hr <0.167 (L)  ALDOS/RENIN RATIO Latest Ref Range: 0.0 - 30.0  >134.7 (H)     Discussed the above lab results with the patient on 01/12/2021 at 1530   He has not had his CAT scan of the abdomen yet We also discussed proceeding with AVS at [facility] interventional radiology   Patient expressed understanding and he had no further questions.    Abby Nena Jordan, MD  [facility] Endocrinology  [facility] [address] [place], [place] [address] Phone: [phone] FAX: [phone]

## 2021-02-02 ENCOUNTER — Other Ambulatory Visit: Payer: BC Managed Care – PPO

## 2021-02-03 ENCOUNTER — Ambulatory Visit
Admission: RE | Admit: 2021-02-03 | Discharge: 2021-02-03 | Disposition: A | Discharge status: Home / Self Care | Payer: BC Managed Care – PPO | Source: Ambulatory Visit | Attending: Internal Medicine | Admitting: Internal Medicine

## 2021-02-03 ENCOUNTER — Other Ambulatory Visit: Payer: Self-pay

## 2021-02-03 DIAGNOSIS — E269 Hyperaldosteronism, unspecified: Secondary | ICD-10-CM | POA: Diagnosis not present

## 2021-02-03 DIAGNOSIS — D3501 Benign neoplasm of right adrenal gland: Secondary | ICD-10-CM | POA: Diagnosis not present

## 2021-02-03 DIAGNOSIS — K449 Diaphragmatic hernia without obstruction or gangrene: Secondary | ICD-10-CM | POA: Diagnosis not present

## 2021-02-03 MED ORDER — IOPAMIDOL (ISOVUE-300) INJECTION 61%
100.0000 mL | Freq: Once | INTRAVENOUS | Status: AC | PRN
  Administered 2021-02-03: 100 mL via INTRAVENOUS

## 2021-02-08 ENCOUNTER — Telehealth: Payer: Self-pay | Admitting: Internal Medicine

## 2021-02-08 NOTE — Telephone Encounter (Signed)
Attempted to contact the pt on 02/08/2021 to discuss CT abdomen  but there was no answer and the voice mail was not set up     CT abdomen 02/03/2021    EXAM: CT ABDOMEN WITHOUT AND WITH CONTRAST   TECHNIQUE: Multidetector CT imaging of the abdomen was performed following the standard protocol before and following the bolus administration of intravenous contrast.   CONTRAST:  16mL ISOVUE-300 IOPAMIDOL (ISOVUE-300) INJECTION 61%   COMPARISON:  CT May 08, 2007   FINDINGS: Lower chest: No acute abnormality.   Hepatobiliary: No suspicious hepatic lesion. Gallbladder is unremarkable. No biliary ductal dilation.   Pancreas: Unremarkable. No pancreatic ductal dilatation or surrounding inflammatory changes.   Spleen: Normal in size without focal abnormality.   Adrenals/Urinary Tract: Enhancing 1.5 cm right adrenal nodule with noncontrast Hounsfield units of 4, postcontrast Hounsfield units of 45 and 10 minute delay Hounsfield units of 9, consistent with an adenoma.   Left adrenal gland is unremarkable.   No hydronephrosis.  No solid enhancing renal masses.   Stomach/Bowel: Radiopaque enteric contrast traverses the descending colon. Small hiatal hernia. No pathologic dilation of small large bowel. No evidence of acute bowel inflammation.   Vascular/Lymphatic: No abdominal aortic aneurysm. No pathologically enlarged abdominal lymph nodes.   Other: No abdominal ascites.   Musculoskeletal: No acute or significant osseous findings.   IMPRESSION: 1. RIGHT 1.5 cm adrenal adenoma. 2. Small hiatal hernia.  Will attempt to contact the pt later     Pollock, MD  [facility] Endocrinology  [facility] [address] [place], [place] [address] Phone: [phone] FAX: [phone]

## 2021-02-09 ENCOUNTER — Telehealth: Payer: Self-pay | Admitting: Internal Medicine

## 2021-02-09 NOTE — Telephone Encounter (Signed)
Discuss CT scan results with the patient on 02/01/2021 at 9:30 AM    Discussed right adrenal adenoma 1.5 cm in diameter He is scheduled for AVS at [facility] interventional radiology and October, 2022     Mack Guise, MD  [facility] Endocrinology  [facility] [address] [place], [place] [address] Phone: [phone] FAX: [phone]

## 2021-03-01 DIAGNOSIS — E269 Hyperaldosteronism, unspecified: Secondary | ICD-10-CM | POA: Diagnosis not present

## 2021-03-07 ENCOUNTER — Other Ambulatory Visit: Payer: Self-pay

## 2021-03-07 ENCOUNTER — Telehealth: Payer: Self-pay | Admitting: Internal Medicine

## 2021-03-07 ENCOUNTER — Encounter: Payer: Self-pay | Admitting: Internal Medicine

## 2021-03-07 ENCOUNTER — Ambulatory Visit (INDEPENDENT_AMBULATORY_CARE_PROVIDER_SITE_OTHER): Payer: BC Managed Care – PPO | Admitting: Internal Medicine

## 2021-03-07 VITALS — BP 146/90 | HR 68 | Ht 74.0 in | Wt 276.8 lb

## 2021-03-07 DIAGNOSIS — Z23 Encounter for immunization: Secondary | ICD-10-CM

## 2021-03-07 DIAGNOSIS — E269 Hyperaldosteronism, unspecified: Secondary | ICD-10-CM | POA: Diagnosis not present

## 2021-03-07 DIAGNOSIS — D3501 Benign neoplasm of right adrenal gland: Secondary | ICD-10-CM | POA: Insufficient documentation

## 2021-03-07 LAB — COMPREHENSIVE METABOLIC PANEL
ALT: 14 U/L (ref 0–53)
AST: 14 U/L (ref 0–37)
Albumin: 4.6 g/dL (ref 3.5–5.2)
Alkaline Phosphatase: 51 U/L (ref 39–117)
BUN: 13 mg/dL (ref 6–23)
CO2: 25 mEq/L (ref 19–32)
Calcium: 9.5 mg/dL (ref 8.4–10.5)
Chloride: 106 mEq/L (ref 96–112)
Creatinine, Ser: 1.14 mg/dL (ref 0.40–1.50)
GFR: 76.48 mL/min (ref >60.00)
Glucose, Bld: 171 mg/dL — ABNORMAL HIGH (ref 70–99)
Potassium: 3.3 mEq/L — ABNORMAL LOW (ref 3.5–5.1)
Sodium: 141 mEq/L (ref 135–145)
Total Bilirubin: 0.7 mg/dL (ref 0.2–1.2)
Total Protein: 7.5 g/dL (ref 6.0–8.3)

## 2021-03-07 NOTE — Telephone Encounter (Signed)
Please let the patient know that his potassium is still low and he needs to increase his potassium to 3 tablets daily   Thanks

## 2021-03-07 NOTE — Patient Instructions (Addendum)
Diltiazem 180 mg daily  Losartan 998 mg daily  Bystolic 20 mg two tabs daily  Hydralazine 100 mg three times a day

## 2021-03-07 NOTE — Telephone Encounter (Signed)
Called and advised pt of results. Pt verbalized understanding.

## 2021-03-07 NOTE — Progress Notes (Signed)
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 He has followed with [place] [facility] since 10/19/2020 for consultative assistance with management of his resistant HTN.   HISTORICAL SUMMARY:  Pt with uncontrolled HTN for > 12-13 yrs ago, despite being on multiple antihypertensive meds to include hydralazine, spironolactone, bystolic, amlodipine and losartan. Of note, the pt has hx of hypokalemia and is on KCL.    His 24- hr urinary dopamine, catecholamine and metanephrines were normal. He has a slightly elevated urinary cortisol at 51 mcg/24 hr but dexamethasone test came back negative with a cortisol of 1.2 ug/dL.    Mother with HTN and renal stones  Father with SM       SUBJECTIVE:    Today (03/07/2021):  Jeffery Bailey is here for primary hyperaldosteronism.  He is accompanied by his wife today   He is status post adrenal venous sampling through [place] on 03/01/2021   Denies headaches or blurry vision  Denies chest or SOB  Denies LE edema  Denies leg cramps      KCL 20 mEq TID a day -taking twice a day Losartan 301 mg daily  Bystolic 20 mg two tabs daily  Diltiazem 180 mg daily  Hydralazine 100 mg TID       HISTORY:  Past Medical History:  Past Medical History:  Diagnosis Date   Diabetes mellitus without complication ([employer])    Hypertension    Obesity    Past Surgical History:  Past Surgical History:  Procedure Laterality Date   NASAL SEPTUM SURGERY     TONSILLECTOMY     Social History:  reports that he has never smoked. He has never used smokeless tobacco. He reports current alcohol use. He reports that he does not use drugs. Family  History:  Family History  Problem Relation Age of Onset   Hyperlipidemia Mother    Diabetes Father      HOME MEDICATIONS: Allergies as of 03/07/2021   No Known Allergies      Medication List        Accurate as of March 07, 2021  4:17 PM. If you have any questions, ask your nurse or doctor.          diltiazem 180 MG 24 hr capsule Commonly known as: DILACOR XR Take 1 capsule (180 mg total) by mouth daily.   empagliflozin 10 MG Tabs tablet Commonly known as: JARDIANCE Take by mouth daily.   hydrALAZINE 100 MG tablet Commonly known as: APRESOLINE Take 1 tablet (100 mg total) by mouth 3 (three) times daily.   losartan 100 MG tablet Commonly known as: COZAAR Take 100 mg by mouth daily.   metFORMIN 1000 MG tablet Commonly known as: GLUCOPHAGE metformin 1,000 mg tablet  TAKE 1 TABLET BY MOUTH TWICE DAILY WITH A MEAL   Nebivolol HCl 20 MG Tabs Take 40 mg by mouth daily in the afternoon.   potassium chloride SA 20 MEQ tablet Commonly known as: KLOR-CON Take 1 tablet (20 mEq total) by mouth 3 (three) times daily.   rosuvastatin 20 MG tablet Commonly known as: CRESTOR Take 20 mg by mouth daily.  OBJECTIVE:   PHYSICAL EXAM: VS: BP (!) 146/90 (BP Location: Left Arm, Patient Position: Sitting, Cuff Size: Large)   Pulse 68   Ht 6\' 2"  (1.88 m)   Wt 276 lb 12.8 oz (125.6 kg)   SpO2 98%   BMI 35.54 kg/m    EXAM: General: Pt appears well and is in NAD  Neck: General: Supple without adenopathy. Thyroid: Thyroid size normal.  No goiter or nodules appreciated. No thyroid bruit.  Lungs: Clear with good BS bilat with no rales, rhonchi, or wheezes  Heart: Auscultation: RRR.  Abdomen: Normoactive bowel sounds, soft, nontender, without masses or organomegaly palpable  Extremities:  BL LE: No pretibial edema normal ROM and strength.  Mental Status: Judgment, insight: Intact Orientation: Oriented to time, place, and person Mood and affect: No  depression, anxiety, or agitation     DATA REVIEWED:   Component    Cortisol, Inferior Vena Cava, Plasma  Cortisol, Left Adrenal Vein, Plasma  Cortisol, Right Adrenal Vein, Plasma   Component 03/01/2021 03/01/2021 03/01/2021       Cortisol, Inferior Vena Cava, Plasma -- -- 23.0  Cortisol, Left Adrenal Vein, Plasma 1,029.0 High    -- --  Cortisol, Right Adrenal Vein, Plasma -- 634.0 High       Component    Aldosterone, IVC  Aldosterone, RAV  Aldosterone, LAV   Component 03/01/2021 03/01/2021 03/01/2021       Aldosterone, IVC 42 -- --  Aldosterone, RAV -- 1,700 --  Aldosterone, LAV -- -- 60    Results for Jeffery Bailey, Jeffery Bailey (MRN [id number]) as of 03/07/2021 16:18  Ref. Range 03/07/2021 08:43  Sodium Latest Ref Range: 135 - 145 mEq/L 141  Potassium Latest Ref Range: 3.5 - 5.1 mEq/L 3.3 (L)  Chloride Latest Ref Range: 96 - 112 mEq/L 106  CO2 Latest Ref Range: 19 - 32 mEq/L 25  Glucose Latest Ref Range: 70 - 99 mg/dL 171 (H)  BUN Latest Ref Range: 6 - 23 mg/dL 13  Creatinine Latest Ref Range: 0.40 - 1.50 mg/dL 1.14  Calcium Latest Ref Range: 8.4 - 10.5 mg/dL 9.5  Alkaline Phosphatase Latest Ref Range: 39 - 117 U/L 51  Albumin Latest Ref Range: 3.5 - 5.2 g/dL 4.6  AST Latest Ref Range: 0 - 37 U/L 14  ALT Latest Ref Range: 0 - 53 U/L 14  Total Protein Latest Ref Range: 6.0 - 8.3 g/dL 7.5  Total Bilirubin Latest Ref Range: 0.2 - 1.2 mg/dL 0.7  GFR Latest Ref Range: >60.00 mL/min 76.48     06/10/2020 Aldo: Renin ratio >79.6 Aldo 13.3 Renin activity < 0.167      24- hr urine  Dopamine urine 152 ug (< 510) Epi 2 ( < 20) Norepi 92 ( < 135)  Meta 170 (<276) Normeta 598 ( <729)  A1c 8.0%     CT abdomen 02/03/2021  Lower chest: No acute abnormality.   Hepatobiliary: No suspicious hepatic lesion. Gallbladder is unremarkable. No biliary ductal dilation.   Pancreas: Unremarkable. No pancreatic ductal dilatation or surrounding inflammatory changes.   Spleen:  Normal in size without focal abnormality.   Adrenals/Urinary Tract: Enhancing 1.5 cm right adrenal nodule with noncontrast Hounsfield units of 4, postcontrast Hounsfield units of 45 and 10 minute delay Hounsfield units of 9, consistent with an adenoma.   Left adrenal gland is unremarkable.   No hydronephrosis.  No solid enhancing renal masses.   Stomach/Bowel: Radiopaque enteric contrast traverses the descending colon. Small hiatal hernia. No pathologic dilation of small  large bowel. No evidence of acute bowel inflammation.   Vascular/Lymphatic: No abdominal aortic aneurysm. No pathologically enlarged abdominal lymph nodes.   Other: No abdominal ascites.   Musculoskeletal: No acute or significant osseous findings.   IMPRESSION: 1. RIGHT 1.5 cm adrenal adenoma. 2. Small hiatal hernia.  ASSESSMENT / PLAN / RECOMMENDATIONS:   Primary Hyperaldosteronism  -Status post AVS on 03/01/2021 with lateralization to the right adrenal gland.  Will refer for surgical evaluation -BP slightly elevated, will continue to monitor -No changes today    Medications   Continue diltiazem 180 mg daily  Losartan 147 mg daily  Bystolic 20 mg two tabs daily  Hydralazine 100 mg TID    2. Hypokalemia :  - Will replenish as below   Medication  Increase Kcl 20 mEq to Three tabs daily    3. Right Adrenal Adenoma:  -1.5 cm right adrenal adenoma is excreting aldosterone, there is no evidence of Cushing syndrome nor pheochromocytoma as his dexamethasone suppression test came back normal as well as 24- hr urinary catecholamine and metanephrines  Follow-up in 4 months   Signed electronically by: Jeffery Guise, MD  [facility] Endocrinology  [place] [facility] [address] [place], [place] [address] Phone: [phone] FAX: [phone]      CC: Seward Carol, MD [address] [place] [address] Phone: [phone]  Fax:  [phone]   Return to Endocrinology clinic as below: Future Appointments  Date Time Provider [place]  07/11/2021  7:50 AM Ramesses Crampton, Melanie Crazier, MD [facility] None

## 2021-03-08 ENCOUNTER — Telehealth: Payer: Self-pay | Admitting: Internal Medicine

## 2021-03-08 NOTE — Telephone Encounter (Signed)
Patient stating he was supposed to have a referral he was suppose to call back to give the doctors name and address which is [address] [place] please advise

## 2021-03-08 NOTE — Addendum Note (Signed)
Addended by: Dorita Sciara on: 03/08/2021 10:36 AM   Modules accepted: Orders

## 2021-03-11 NOTE — Telephone Encounter (Signed)
Oto received a referral to Gen Surg called and requested records for patient. Fax number [phone]

## 2021-03-11 NOTE — Telephone Encounter (Signed)
Office note from 03/07/2021 faxed to number below

## 2021-03-24 DIAGNOSIS — E278 Other specified disorders of adrenal gland: Secondary | ICD-10-CM | POA: Diagnosis not present

## 2021-03-28 DIAGNOSIS — Z0000 Encounter for general adult medical examination without abnormal findings: Secondary | ICD-10-CM | POA: Diagnosis not present

## 2021-03-28 DIAGNOSIS — E1169 Type 2 diabetes mellitus with other specified complication: Secondary | ICD-10-CM | POA: Diagnosis not present

## 2021-03-28 DIAGNOSIS — E7800 Pure hypercholesterolemia, unspecified: Secondary | ICD-10-CM | POA: Diagnosis not present

## 2021-03-28 DIAGNOSIS — I10 Essential (primary) hypertension: Secondary | ICD-10-CM | POA: Diagnosis not present

## 2021-04-22 DIAGNOSIS — N189 Chronic kidney disease, unspecified: Secondary | ICD-10-CM | POA: Diagnosis not present

## 2021-04-22 DIAGNOSIS — D3501 Benign neoplasm of right adrenal gland: Secondary | ICD-10-CM | POA: Diagnosis not present

## 2021-04-22 DIAGNOSIS — E2609 Other primary hyperaldosteronism: Secondary | ICD-10-CM | POA: Diagnosis not present

## 2021-04-22 DIAGNOSIS — E876 Hypokalemia: Secondary | ICD-10-CM | POA: Diagnosis not present

## 2021-04-22 DIAGNOSIS — E1122 Type 2 diabetes mellitus with diabetic chronic kidney disease: Secondary | ICD-10-CM | POA: Diagnosis not present

## 2021-04-22 DIAGNOSIS — I129 Hypertensive chronic kidney disease with stage 1 through stage 4 chronic kidney disease, or unspecified chronic kidney disease: Secondary | ICD-10-CM | POA: Diagnosis not present

## 2021-04-22 DIAGNOSIS — E785 Hyperlipidemia, unspecified: Secondary | ICD-10-CM | POA: Diagnosis not present

## 2021-04-22 DIAGNOSIS — Z79899 Other long term (current) drug therapy: Secondary | ICD-10-CM | POA: Diagnosis not present

## 2021-04-22 DIAGNOSIS — Z8616 Personal history of COVID-19: Secondary | ICD-10-CM | POA: Diagnosis not present

## 2021-04-22 DIAGNOSIS — K449 Diaphragmatic hernia without obstruction or gangrene: Secondary | ICD-10-CM | POA: Diagnosis not present

## 2021-05-04 DIAGNOSIS — D3501 Benign neoplasm of right adrenal gland: Secondary | ICD-10-CM | POA: Diagnosis not present

## 2021-05-04 DIAGNOSIS — Z79899 Other long term (current) drug therapy: Secondary | ICD-10-CM | POA: Diagnosis not present

## 2021-05-04 DIAGNOSIS — E1122 Type 2 diabetes mellitus with diabetic chronic kidney disease: Secondary | ICD-10-CM | POA: Diagnosis not present

## 2021-05-04 DIAGNOSIS — I10 Essential (primary) hypertension: Secondary | ICD-10-CM | POA: Diagnosis not present

## 2021-05-04 DIAGNOSIS — E876 Hypokalemia: Secondary | ICD-10-CM | POA: Diagnosis not present

## 2021-05-04 DIAGNOSIS — E2609 Other primary hyperaldosteronism: Secondary | ICD-10-CM | POA: Diagnosis not present

## 2021-05-04 DIAGNOSIS — E278 Other specified disorders of adrenal gland: Secondary | ICD-10-CM | POA: Diagnosis not present

## 2021-05-04 DIAGNOSIS — I129 Hypertensive chronic kidney disease with stage 1 through stage 4 chronic kidney disease, or unspecified chronic kidney disease: Secondary | ICD-10-CM | POA: Diagnosis not present

## 2021-05-04 DIAGNOSIS — N189 Chronic kidney disease, unspecified: Secondary | ICD-10-CM | POA: Diagnosis not present

## 2021-05-04 DIAGNOSIS — E785 Hyperlipidemia, unspecified: Secondary | ICD-10-CM | POA: Diagnosis not present

## 2021-05-04 DIAGNOSIS — K449 Diaphragmatic hernia without obstruction or gangrene: Secondary | ICD-10-CM | POA: Diagnosis not present

## 2021-05-04 DIAGNOSIS — Z8616 Personal history of COVID-19: Secondary | ICD-10-CM | POA: Diagnosis not present

## 2021-05-05 DIAGNOSIS — I129 Hypertensive chronic kidney disease with stage 1 through stage 4 chronic kidney disease, or unspecified chronic kidney disease: Secondary | ICD-10-CM | POA: Diagnosis not present

## 2021-05-05 DIAGNOSIS — E2609 Other primary hyperaldosteronism: Secondary | ICD-10-CM | POA: Diagnosis not present

## 2021-05-05 DIAGNOSIS — Z8616 Personal history of COVID-19: Secondary | ICD-10-CM | POA: Diagnosis not present

## 2021-05-05 DIAGNOSIS — D3501 Benign neoplasm of right adrenal gland: Secondary | ICD-10-CM | POA: Diagnosis not present

## 2021-05-05 DIAGNOSIS — E785 Hyperlipidemia, unspecified: Secondary | ICD-10-CM | POA: Diagnosis not present

## 2021-05-05 DIAGNOSIS — Z79899 Other long term (current) drug therapy: Secondary | ICD-10-CM | POA: Diagnosis not present

## 2021-05-05 DIAGNOSIS — E1122 Type 2 diabetes mellitus with diabetic chronic kidney disease: Secondary | ICD-10-CM | POA: Diagnosis not present

## 2021-05-05 DIAGNOSIS — K449 Diaphragmatic hernia without obstruction or gangrene: Secondary | ICD-10-CM | POA: Diagnosis not present

## 2021-05-05 DIAGNOSIS — N189 Chronic kidney disease, unspecified: Secondary | ICD-10-CM | POA: Diagnosis not present

## 2021-05-05 DIAGNOSIS — E876 Hypokalemia: Secondary | ICD-10-CM | POA: Diagnosis not present

## 2021-05-08 ENCOUNTER — Other Ambulatory Visit: Payer: Self-pay | Admitting: Internal Medicine

## 2021-05-10 DIAGNOSIS — E269 Hyperaldosteronism, unspecified: Secondary | ICD-10-CM | POA: Diagnosis not present

## 2021-06-06 IMAGING — DX DG FOOT COMPLETE 3+V*L*
2 series · 3 of 3 positions shown · non-contrast
Comparison: None.

CLINICAL DATA: 45-year-old male with left foot pain after fall down
stairs yesterday.

EXAM:
LEFT FOOT - COMPLETE 3+ VIEW

[Series 1: foot · 0.14mm/px · 2 of 2 slices shown]
[im 1/2]
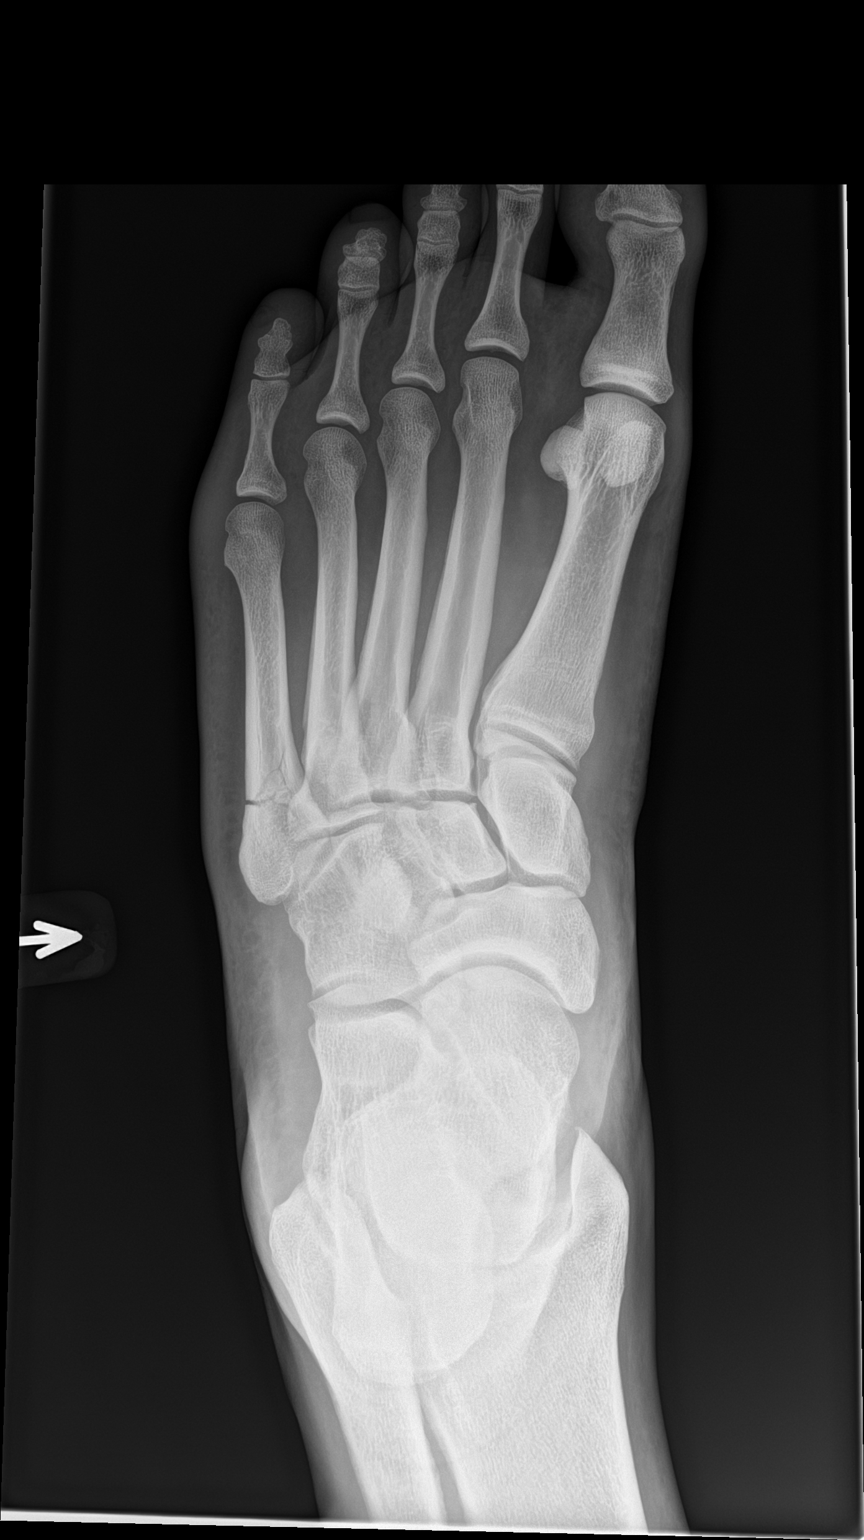
[im 2/2]
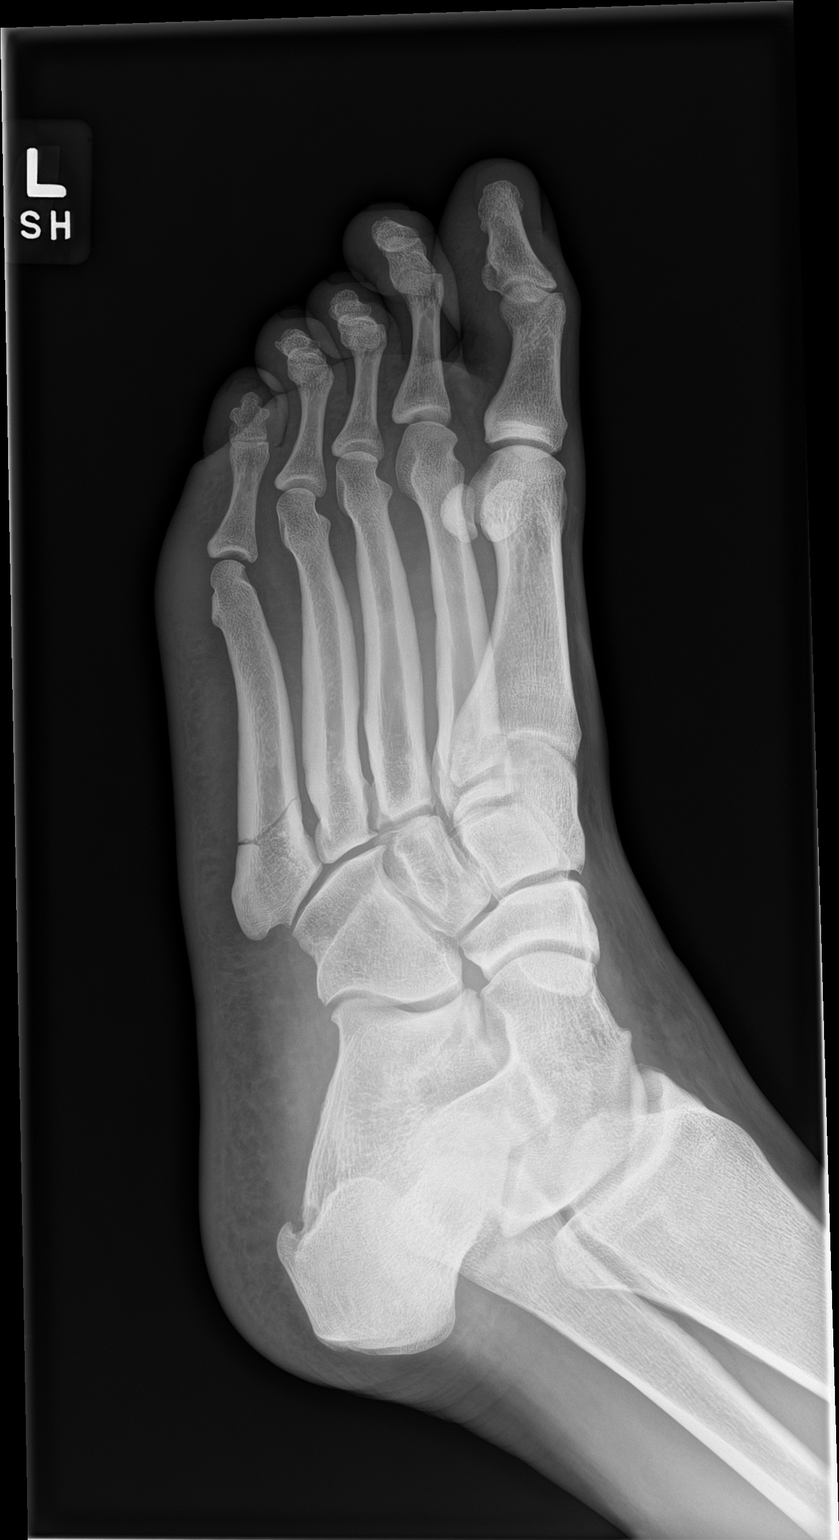

[leg]
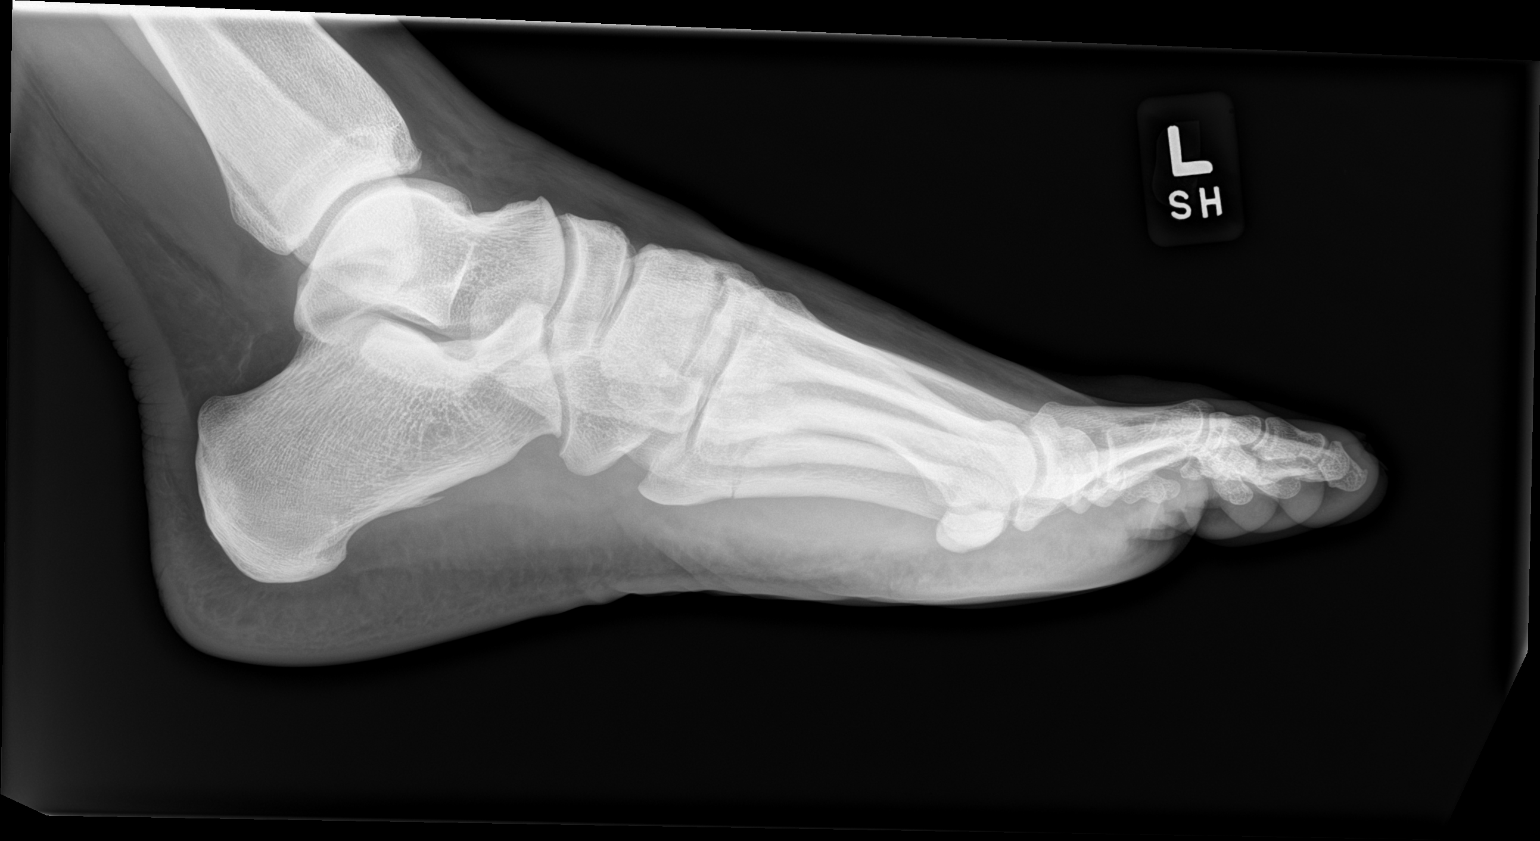

[3 of 3 positions shown; findings below may reference images not displayed]

FINDINGS: Comminuted nondisplaced fracture involving the proximal
metadiaphysis and shaft of the left 5th metatarsal. Fracture lucency
extends to the 5th TMT joint which appears maintained. Normal
underlying bone mineralization. Other osseous structures, joint
spaces and alignment appear normal.
IMPRESSION: Comminuted and intra-articular but nondisplaced fracture of the
proximal left 5th metatarsal.

## 2021-07-11 ENCOUNTER — Ambulatory Visit: Payer: Self-pay | Admitting: Internal Medicine

## 2021-07-11 NOTE — Progress Notes (Unsigned)
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 He has followed with [facility] since 10/19/2020 for consultative assistance with management of his resistant HTN.   HISTORICAL SUMMARY:  Pt with uncontrolled HTN for > 12-13 yrs ago, despite being on multiple antihypertensive meds to include hydralazine, spironolactone, bystolic, amlodipine and losartan. Of note, the pt has hx of hypokalemia and is on KCL.    His 24- hr urinary dopamine, catecholamine and metanephrines were normal. He has a slightly elevated urinary cortisol at 51 mcg/24 hr but dexamethasone test came back negative with a cortisol of 1.2 ug/dL.  He is S/P laparoscopic right adrenalectomy  2x1.5x1.4 cm on 05/04/2021 . Pre-op he was on  Diltiazem, losartan, bystolic and hydralazine, as well as potassium.   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  He is accompanied by his wife today   He is status post right adrenalectomy 04/2021   Denies headaches or blurry vision  Denies chest or SOB  Denies LE edema  Denies leg cramps      KCL 20 mEq TID a day -taking twice a day Losartan 631 mg daily  Bystolic 20 mg two tabs daily  Diltiazem 180 mg daily  Hydralazine 100 mg TID       HISTORY:  Past Medical History:  Past Medical History:  Diagnosis Date   Diabetes mellitus without complication ([place])    Hypertension    Obesity    Past Surgical History:  Past Surgical History:  Procedure Laterality Date   NASAL SEPTUM SURGERY     TONSILLECTOMY     Social History:  reports that he has  never smoked. He has never used smokeless tobacco. He reports current alcohol use. He reports that he does not use drugs. Family History:  Family History  Problem Relation Age of Onset   Hyperlipidemia Mother    Diabetes Father      HOME MEDICATIONS: Allergies as of 07/11/2021   No Known Allergies      Medication List        Accurate as of July 11, 2021  7:34 AM. If you have any questions, ask your nurse or doctor.          Dilt-XR 180 MG 24 hr capsule Generic drug: diltiazem TAKE 1 CAPSULE(180 MG) BY MOUTH DAILY   empagliflozin 10 MG Tabs tablet Commonly known as: JARDIANCE Take by mouth daily.   hydrALAZINE 100 MG tablet Commonly known as: APRESOLINE Take 1 tablet (100 mg total) by mouth 3 (three) times daily.   losartan 100 MG tablet Commonly known as: COZAAR Take 100 mg by mouth daily.   metFORMIN 1000 MG tablet Commonly known as: GLUCOPHAGE metformin 1,000 mg tablet  TAKE 1 TABLET BY MOUTH TWICE DAILY WITH A MEAL   Nebivolol HCl 20 MG Tabs Take 40 mg by mouth daily in the afternoon.   potassium chloride SA 20 MEQ tablet Commonly known as: KLOR-CON M Take 1 tablet (20 mEq total) by mouth 3 (three) times daily.  rosuvastatin 20 MG tablet Commonly known as: CRESTOR Take 20 mg by mouth daily.          OBJECTIVE:   PHYSICAL EXAM: VS: There were no vitals taken for this visit.   EXAM: General: Pt appears well and is in NAD  Neck: General: Supple without adenopathy. Thyroid: Thyroid size normal.  No goiter or nodules appreciated. No thyroid bruit.  Lungs: Clear with good BS bilat with no rales, rhonchi, or wheezes  Heart: Auscultation: RRR.  Abdomen: Normoactive bowel sounds, soft, nontender, without masses or organomegaly palpable  Extremities:  BL LE: No pretibial edema normal ROM and strength.  Mental Status: Judgment, insight: Intact Orientation: Oriented to time, place, and person Mood and affect: No depression, anxiety, or  agitation     DATA REVIEWED:   Component    Cortisol, Inferior Vena Cava, Plasma  Cortisol, Left Adrenal Vein, Plasma  Cortisol, Right Adrenal Vein, Plasma   Component 03/01/2021 03/01/2021 03/01/2021       Cortisol, Inferior Vena Cava, Plasma -- -- 23.0  Cortisol, Left Adrenal Vein, Plasma 1,029.0 High    -- --  Cortisol, Right Adrenal Vein, Plasma -- 634.0 High       Component    Aldosterone, IVC  Aldosterone, RAV  Aldosterone, LAV   Component 03/01/2021 03/01/2021 03/01/2021       Aldosterone, IVC 42 -- --  Aldosterone, RAV -- 1,700 --  Aldosterone, LAV -- -- 60    Results for Jeffery Bailey, Jeffery Bailey (MRN [id number]) as of 03/07/2021 16:18  Ref. Range 03/07/2021 08:43  Sodium Latest Ref Range: 135 - 145 mEq/L 141  Potassium Latest Ref Range: 3.5 - 5.1 mEq/L 3.3 (L)  Chloride Latest Ref Range: 96 - 112 mEq/L 106  CO2 Latest Ref Range: 19 - 32 mEq/L 25  Glucose Latest Ref Range: 70 - 99 mg/dL 171 (H)  BUN Latest Ref Range: 6 - 23 mg/dL 13  Creatinine Latest Ref Range: 0.40 - 1.50 mg/dL 1.14  Calcium Latest Ref Range: 8.4 - 10.5 mg/dL 9.5  Alkaline Phosphatase Latest Ref Range: 39 - 117 U/L 51  Albumin Latest Ref Range: 3.5 - 5.2 g/dL 4.6  AST Latest Ref Range: 0 - 37 U/L 14  ALT Latest Ref Range: 0 - 53 U/L 14  Total Protein Latest Ref Range: 6.0 - 8.3 g/dL 7.5  Total Bilirubin Latest Ref Range: 0.2 - 1.2 mg/dL 0.7  GFR Latest Ref Range: >60.00 mL/min 76.48     06/10/2020 Aldo: Renin ratio >79.6 Aldo 13.3 Renin activity < 0.167      24- hr urine  Dopamine urine 152 ug (< 510) Epi 2 ( < 20) Norepi 92 ( < 135)  Meta 170 (<276) Normeta 598 ( <729)  A1c 8.0%     CT abdomen 02/03/2021  Lower chest: No acute abnormality.   Hepatobiliary: No suspicious hepatic lesion. Gallbladder is unremarkable. No biliary ductal dilation.   Pancreas: Unremarkable. No pancreatic ductal dilatation or surrounding inflammatory changes.   Spleen: Normal in size without focal  abnormality.   Adrenals/Urinary Tract: Enhancing 1.5 cm right adrenal nodule with noncontrast Hounsfield units of 4, postcontrast Hounsfield units of 45 and 10 minute delay Hounsfield units of 9, consistent with an adenoma.   Left adrenal gland is unremarkable.   No hydronephrosis.  No solid enhancing renal masses.   Stomach/Bowel: Radiopaque enteric contrast traverses the descending colon. Small hiatal hernia. No pathologic dilation of small large bowel. No evidence of acute bowel inflammation.   Vascular/Lymphatic: No  abdominal aortic aneurysm. No pathologically enlarged abdominal lymph nodes.   Other: No abdominal ascites.   Musculoskeletal: No acute or significant osseous findings.   IMPRESSION: 1. RIGHT 1.5 cm adrenal adenoma. 2. Small hiatal hernia.  ASSESSMENT / PLAN / RECOMMENDATIONS:   Primary Hyperaldosteronism  -Status post AVS on 03/01/2021 with lateralization to the right adrenal gland.  Will refer for surgical evaluation -BP slightly elevated, will continue to monitor -No changes today    Medications   Continue diltiazem 180 mg daily  Losartan 299 mg daily  Bystolic 20 mg two tabs daily  Hydralazine 100 mg TID    2. Hypokalemia :  - Will replenish as below   Medication  Increase Kcl 20 mEq to Three tabs daily    3. Right Adrenal Adenoma:  -1.5 cm right adrenal adenoma is excreting aldosterone, there is no evidence of Cushing syndrome nor pheochromocytoma as his dexamethasone suppression test came back normal as well as 24- hr urinary catecholamine and metanephrines  Follow-up in 4 months   Signed electronically by: Mack Guise, MD  [facility]  [facility] [facility] [address] [place], [place] [address] Phone: [phone] FAX: [phone]      CC: Seward Carol, MD [address] [place] [address] Phone: [phone]  Fax: [phone]   Return to Endocrinology  clinic as below: Future Appointments  Date Time Provider [place]  07/11/2021  7:50 AM Matheus Spiker, Melanie Crazier, MD [facility] None

## 2021-10-07 IMAGING — CT CT ABDOMEN WO/W CM
2 of 4 series · 14 of 32 positions shown, 19 images · IV contrast (APPLIED)
Comparison: CT May 08, 2007

CLINICAL DATA: Hyperaldosteronism, evaluate for adrenal adenoma.

EXAM:
CT ABDOMEN WITHOUT AND WITH CONTRAST
TECHNIQUE: Multidetector CT imaging of the abdomen was performed following the
standard protocol before and following the bolus administration of
intravenous contrast.
CONTRAST:  100mL XHJMOU-TTT IOPAMIDOL (XHJMOU-TTT) INJECTION 61%

[Series 2: adrenal w/(date) · axial · 0.88mm/px · z∈[-352,-106]mm · 8 of 159 slices shown, 13 images]
[im 18/159  soft-tissue]
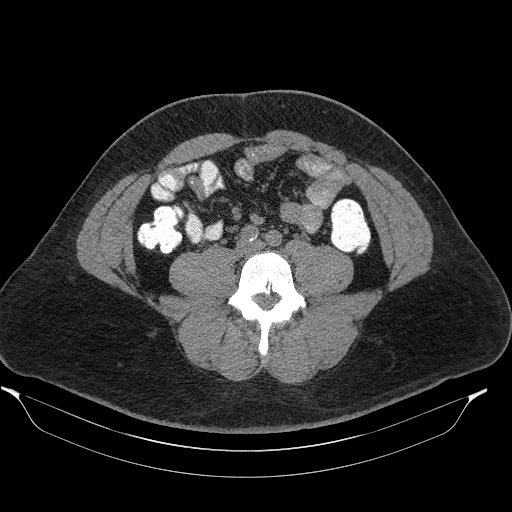
[im 18/159  bone]
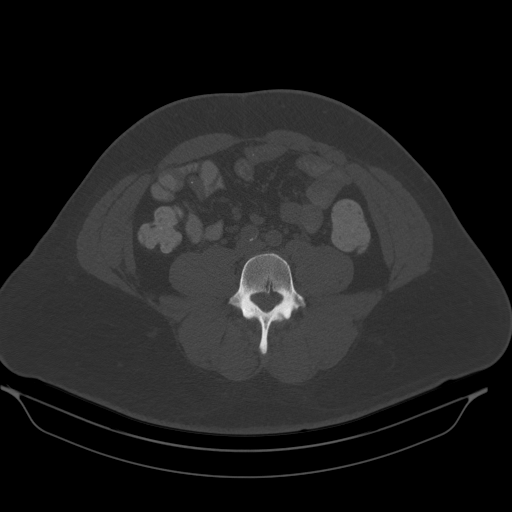
[im 36/159  soft-tissue]
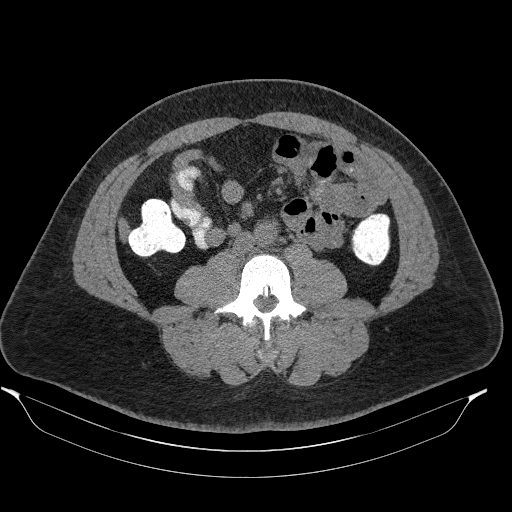
[im 53/159  soft-tissue]
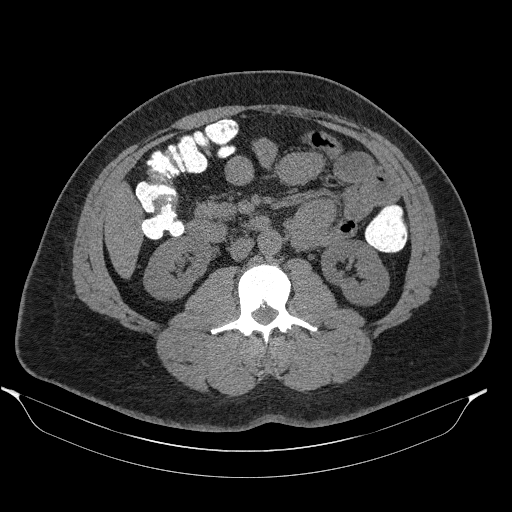
[im 71/159  soft-tissue]
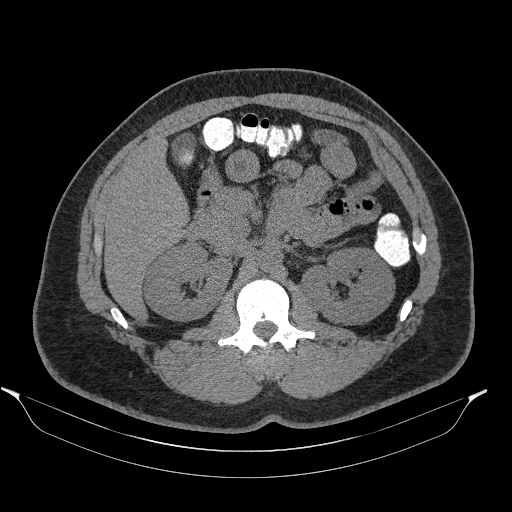
[im 88/159  soft-tissue]
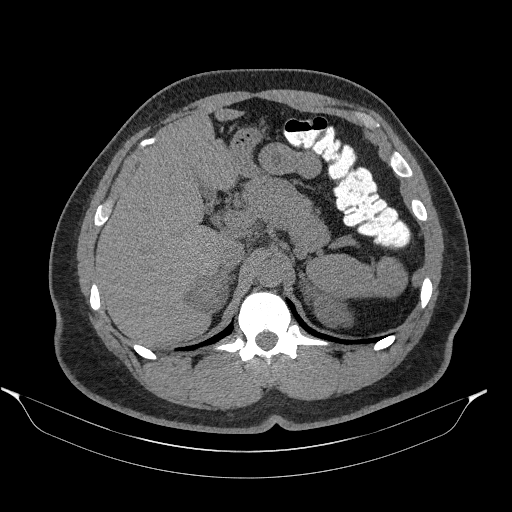
[im 88/159  lung]
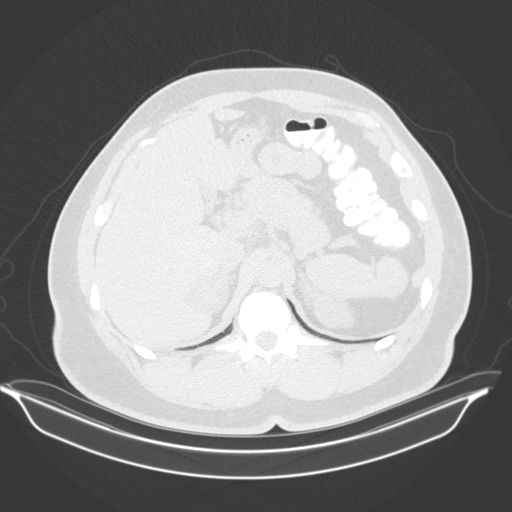
[im 106/159  soft-tissue]
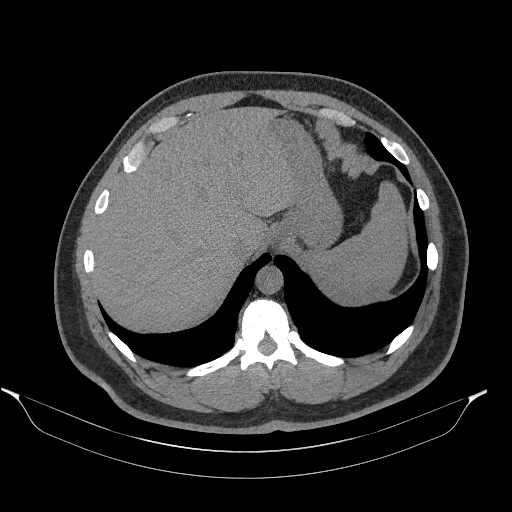
[im 106/159  lung]
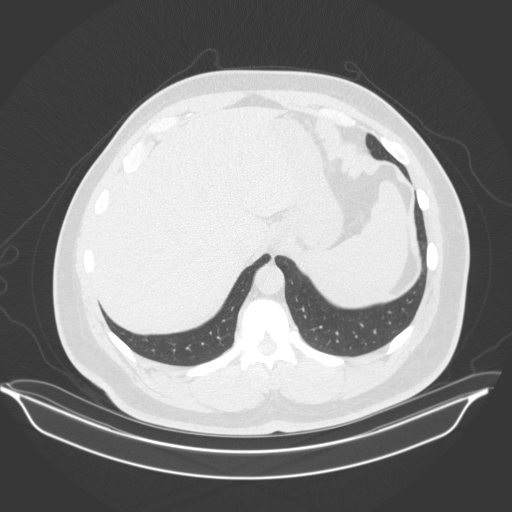
[im 123/159  soft-tissue]
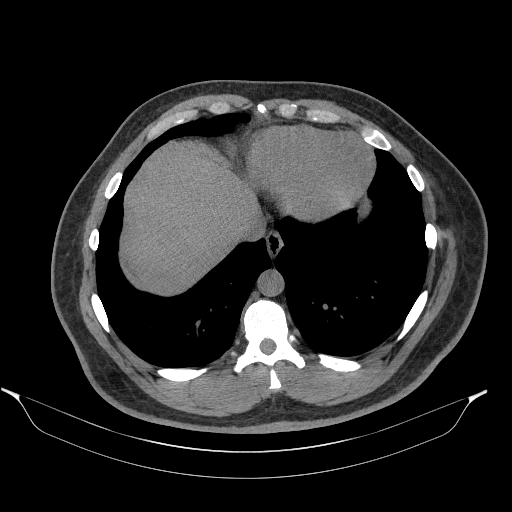
[im 123/159  lung]
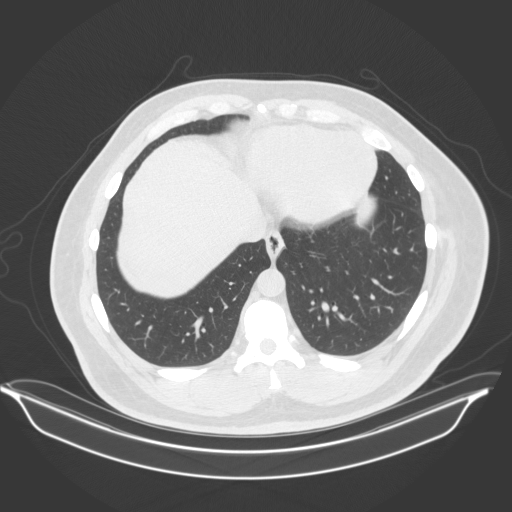
[im 141/159  soft-tissue]
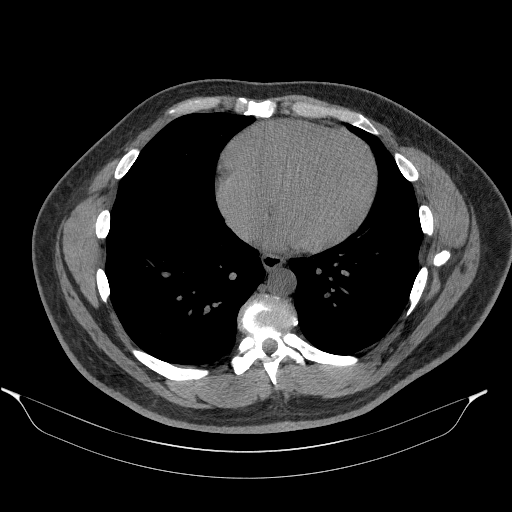
[im 141/159  lung]
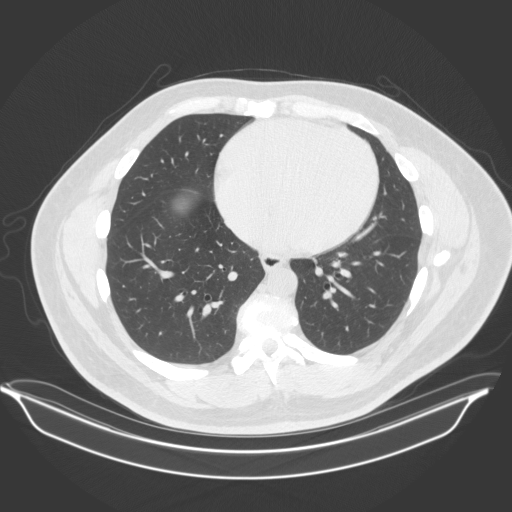

[Series 8: 10 min delay · axial · delayed · 0.88mm/px · z∈[-348,-150]mm · 6 of 159 slices shown]
[im 20/159  soft-tissue]
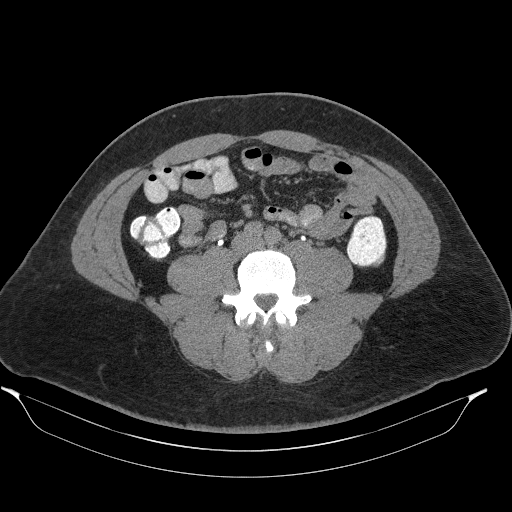
[im 40/159  soft-tissue]
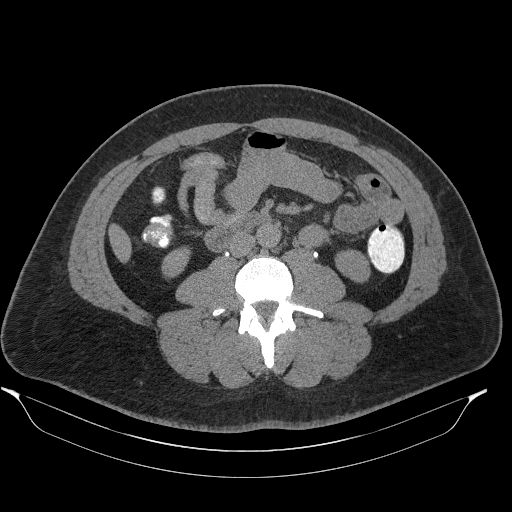
[im 60/159  soft-tissue]
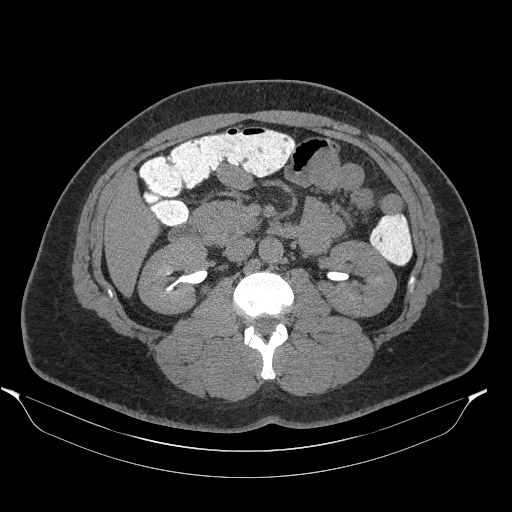
[im 80/159  soft-tissue]
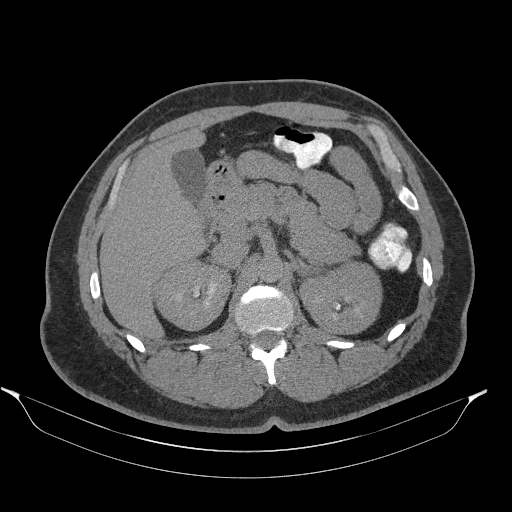
[im 99/159  soft-tissue]
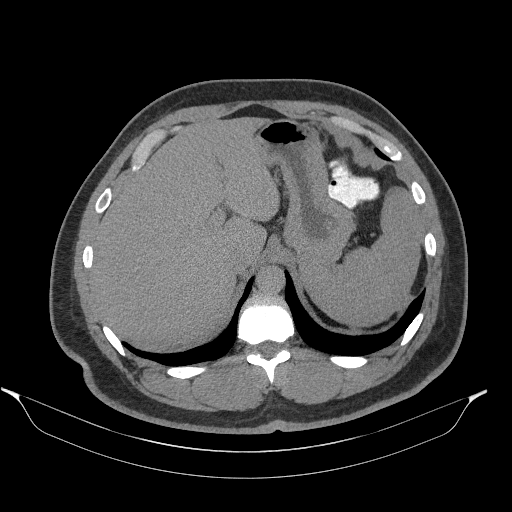
[im 119/159  soft-tissue]
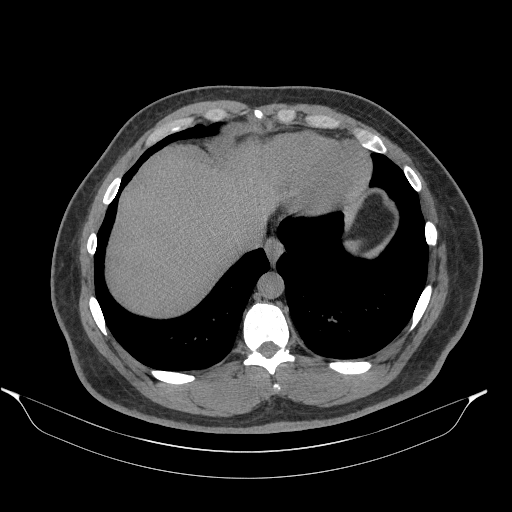

[14 of 32 positions shown; findings below may reference images not displayed]

FINDINGS: Lower chest: No acute abnormality.

Hepatobiliary: No suspicious hepatic lesion. Gallbladder is
unremarkable. No biliary ductal dilation.

Pancreas: Unremarkable. No pancreatic ductal dilatation or
surrounding inflammatory changes.

Spleen: Normal in size without focal abnormality.

Adrenals/Urinary Tract: Enhancing 1.5 cm right adrenal nodule with
noncontrast Hounsfield units of 4, postcontrast Hounsfield units of
45 and 10 minute delay Hounsfield units of 9, consistent with an
adenoma.

Left adrenal gland is unremarkable.

No hydronephrosis.  No solid enhancing renal masses.

Stomach/Bowel: Radiopaque enteric contrast traverses the descending
colon. Small hiatal hernia. No pathologic dilation of small large
bowel. No evidence of acute bowel inflammation.

Vascular/Lymphatic: No abdominal aortic aneurysm. No pathologically
enlarged abdominal lymph nodes.

Other: No abdominal ascites.

Musculoskeletal: No acute or significant osseous findings.
IMPRESSION: 1. RIGHT 1.5 cm adrenal adenoma.
2. Small hiatal hernia.

## 2022-06-14 ENCOUNTER — Encounter: Payer: Self-pay | Admitting: Internal Medicine

## 2022-06-14 ENCOUNTER — Ambulatory Visit (INDEPENDENT_AMBULATORY_CARE_PROVIDER_SITE_OTHER): Payer: Managed Care, Other (non HMO) | Admitting: Internal Medicine

## 2022-06-14 VITALS — Ht 74.0 in | Wt 268.0 lb

## 2022-06-14 DIAGNOSIS — E896 Postprocedural adrenocortical (-medullary) hypofunction: Secondary | ICD-10-CM

## 2022-06-14 DIAGNOSIS — R110 Nausea: Secondary | ICD-10-CM

## 2022-06-14 NOTE — Progress Notes (Signed)
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 is a 47 y.o., male with a past medical history of T2DM, HTN, Dyslipidemia and hx of renal stones. He has followed with [facility] since 10/19/2020 for consultative assistance with management of his resistant HTN.   HISTORICAL SUMMARY:  Pt with uncontrolled HTN for > 12-13 yrs ago, despite being on multiple antihypertensive meds to include hydralazine, spironolactone, bystolic, amlodipine and losartan. Of note, the pt has hx of hypokalemia and is on KCL.    His 24- hr urinary dopamine, catecholamine and metanephrines were normal. He has a slightly elevated urinary cortisol at 51 mcg/24 hr but dexamethasone test came back negative with a cortisol of 1.2 ug/dL.   He is s/p adrenal venous sampling at [facility] on 03/01/2021 with lateralization to the right adrenal adenoma   He is s/p adrenalectomy with Dr. 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  He is accompanied by his wife today   He is status post right adrenalectomy 04/2021   He recovered well from the surgery but a couple months ago he noted nausea each time he goes to the [facility]  for leg exercise which coincides with day # 4 post ozempic  Has been on Ozempic for 7-8 months  He did have dizziness in the past  No glucose check during these episodes  Per spouse the pt was noted to be orthostatic at PCP's office 06/07/2022  Losartan 25 mg daily        HISTORY:  Past Medical History:  Past Medical History:  Diagnosis Date   Diabetes mellitus without complication ([place])    Hypertension    Obesity    Past Surgical History:   Past Surgical History:  Procedure Laterality Date   NASAL SEPTUM SURGERY     TONSILLECTOMY     Social History:  reports that he has never smoked. He has never used smokeless tobacco. He reports current alcohol use. He reports that he does not use drugs. Family History:  Family History  Problem Relation Age of Onset   Hyperlipidemia Mother    Diabetes Father      HOME MEDICATIONS: Allergies as of 06/14/2022   No Known Allergies      Medication List        Accurate as of June 14, 2022  9:08 AM. If you have any questions, ask your nurse or doctor.          STOP taking these medications    Dilt-XR 180 MG 24 hr capsule Generic drug: diltiazem Stopped by: Dorita Sciara, MD   hydrALAZINE 100 MG tablet Commonly known as: APRESOLINE Stopped by: Dorita Sciara, MD   Nebivolol HCl 20 MG Tabs Stopped by: Dorita Sciara, MD   potassium chloride SA 20 MEQ tablet Commonly known as: KLOR-CON M Stopped by: Dorita Sciara, MD   rosuvastatin 20 MG tablet Commonly known as: CRESTOR Stopped by: Dorita Sciara, MD       TAKE these medications    empagliflozin 10 MG Tabs tablet Commonly known as: JARDIANCE Take by mouth daily.  losartan 100 MG tablet Commonly known as: COZAAR Take 100 mg by mouth daily.   metFORMIN 1000 MG tablet Commonly known as: GLUCOPHAGE metformin 1,000 mg tablet  TAKE 1 TABLET BY MOUTH TWICE DAILY WITH A MEAL   Ozempic (1 MG/DOSE) 4 MG/3ML Sopn Generic drug: Semaglutide (1 MG/DOSE) Inject 1 mg into the skin once a week.          OBJECTIVE:   PHYSICAL EXAM: VS: BP 120/80 (BP Location: Left Arm, Patient Position: Sitting, Cuff Size: Large)   Pulse 88   Ht '6\' 2"'$  (1.88 m)   Wt 268 lb (121.6 kg)   SpO2 99%   BMI 34.41 kg/m     BP Supine 118/80  standing 120/90  EXAM: General: Pt appears well and is in NAD  Lungs: Clear with good BS bilat with no rales, rhonchi, or wheezes  Heart:  Auscultation: RRR.  Abdomen: Normoactive bowel sounds, soft, nontender, without masses or organomegaly palpable  Extremities:  BL LE: No pretibial edema normal ROM and strength.  Mental Status: Judgment, insight: Intact Orientation: Oriented to time, place, and person Mood and affect: No depression, anxiety, or agitation     DATA REVIEWED:  05/17/2022 A1c 7.6% BUN 24 GFR 76 Cr. 1.790 K 5 TSH 2.190 uIU/mL   ASSESSMENT / PLAN / RECOMMENDATIONS:   Nausea:   -This is a very vague symptom as this is only been associated with exercising at the gym -We did discuss that Ozempic is associated with GI side effects including nausea -There is NO evidence  orthostatic hypotension today -I have encouraged hydration -Patient advised to check glucose during the symptoms to make sure he is not having hypoglycemia during exercise -I doubt that this is related to his adrenal surgery as he is 14 months postoperative, and typically any presentation would be immediately postoperative -Patient will follow-up with PCP end of this week, might consider reducing losartan  3. Hx of Right Adrenalectomy:  -S/P right adrenalectomy 04/2021 - Only requiring losartan      Follow-up as needed  Signed electronically by: Mack Guise, MD  [facility]  [address] [place], [place] [address] Phone: [phone] FAX: [phone]      CC: Seward Carol, [address] [place] [address] Phone: [phone]  Fax: [phone]   Return to Endocrinology clinic as below: Future Appointments  Date Time Provider [place]  06/14/2022  9:10 AM Jeffery Bailey, Melanie Crazier, MD [facility] None

## 2022-06-16 ENCOUNTER — Ambulatory Visit
Admission: RE | Admit: 2022-06-16 | Discharge: 2022-06-16 | Disposition: A | Discharge status: Home / Self Care | Payer: Managed Care, Other (non HMO) | Source: Ambulatory Visit | Attending: Internal Medicine | Admitting: Internal Medicine

## 2022-06-16 ENCOUNTER — Other Ambulatory Visit: Payer: Self-pay | Admitting: Internal Medicine

## 2022-06-16 DIAGNOSIS — R112 Nausea with vomiting, unspecified: Secondary | ICD-10-CM

## 2022-09-01 ENCOUNTER — Ambulatory Visit: Payer: Self-pay | Admitting: Internal Medicine
